# Patient Record
Sex: Female | Born: 1995 | Race: Black or African American | Hispanic: No | Marital: Single | Smoking: Never smoker
Health system: Southern US, Community
[De-identification: ages and names within clinical notes are randomized; demographics above are authoritative.]

## PROBLEM LIST (undated history)

## (undated) DIAGNOSIS — B009 Herpesviral infection, unspecified: Secondary | ICD-10-CM

## (undated) DIAGNOSIS — J45909 Unspecified asthma, uncomplicated: Secondary | ICD-10-CM

## (undated) DIAGNOSIS — K769 Liver disease, unspecified: Secondary | ICD-10-CM

## (undated) HISTORY — PX: NO PAST SURGERIES: SHX2092

## (undated) HISTORY — PX: WISDOM TOOTH EXTRACTION: SHX21

## (undated) HISTORY — DX: Liver disease, unspecified: K76.9

---
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
## 2014-01-14 ENCOUNTER — Inpatient Hospital Stay (HOSPITAL_COMMUNITY): Payer: Medicaid Other

## 2014-01-14 ENCOUNTER — Encounter (HOSPITAL_COMMUNITY): Payer: Self-pay | Admitting: General Practice

## 2014-01-14 ENCOUNTER — Inpatient Hospital Stay (HOSPITAL_COMMUNITY)
Admission: AD | Admit: 2014-01-14 | Discharge: 2014-01-14 | Disposition: A | Discharge status: Home / Self Care | Payer: Medicaid Other | Source: Ambulatory Visit | Attending: Obstetrics and Gynecology | Admitting: Obstetrics and Gynecology

## 2014-01-14 DIAGNOSIS — N72 Inflammatory disease of cervix uteri: Secondary | ICD-10-CM | POA: Insufficient documentation

## 2014-01-14 DIAGNOSIS — N949 Unspecified condition associated with female genital organs and menstrual cycle: Secondary | ICD-10-CM | POA: Insufficient documentation

## 2014-01-14 DIAGNOSIS — N898 Other specified noninflammatory disorders of vagina: Secondary | ICD-10-CM | POA: Diagnosis present

## 2014-01-14 DIAGNOSIS — R102 Pelvic and perineal pain: Secondary | ICD-10-CM

## 2014-01-14 LAB — URINALYSIS, ROUTINE W REFLEX MICROSCOPIC
Glucose, UA: NEGATIVE mg/dL
Hgb urine dipstick: NEGATIVE
KETONES UR: 40 mg/dL — AB
Leukocytes, UA: NEGATIVE
Nitrite: NEGATIVE
PH: 6 (ref 5.0–8.0)
Protein, ur: 30 mg/dL — AB
Specific Gravity, Urine: 1.025 (ref 1.005–1.030)
Urobilinogen, UA: 0.2 mg/dL (ref 0.0–1.0)

## 2014-01-14 LAB — WET PREP, GENITAL
CLUE CELLS WET PREP: NONE SEEN
TRICH WET PREP: NONE SEEN
Yeast Wet Prep HPF POC: NONE SEEN

## 2014-01-14 LAB — POCT PREGNANCY, URINE: Preg Test, Ur: NEGATIVE

## 2014-01-14 LAB — URINE MICROSCOPIC-ADD ON

## 2014-01-14 LAB — RPR

## 2014-01-14 LAB — HIV ANTIBODY (ROUTINE TESTING W REFLEX): HIV 1&2 Ab, 4th Generation: NONREACTIVE

## 2014-01-14 MED ORDER — NYSTATIN-TRIAMCINOLONE 100000-0.1 UNIT/GM-% EX CREA
TOPICAL_CREAM | Freq: Once | CUTANEOUS | Status: AC
Start: 2014-01-14 — End: 2014-01-14
  Administered 2014-01-14: 17:00:00 via TOPICAL
  Filled 2014-01-14: qty 15

## 2014-01-14 MED ORDER — CEFTRIAXONE SODIUM 250 MG IJ SOLR
250.0000 mg | Freq: Once | INTRAMUSCULAR | Status: AC
  Administered 2014-01-14: 250 mg via INTRAMUSCULAR
  Filled 2014-01-14: qty 250

## 2014-01-14 MED ORDER — AZITHROMYCIN 250 MG PO TABS
1000.0000 mg | ORAL_TABLET | Freq: Once | ORAL | Status: AC
  Administered 2014-01-14: 1000 mg via ORAL
  Filled 2014-01-14: qty 4

## 2014-01-14 NOTE — Discharge Instructions (Signed)
Abdominal Pain, Women °Abdominal (stomach, pelvic, or belly) pain can be caused by many things. It is important to tell your doctor: °· The location of the pain. °· Does it come and go or is it present all the time? °· Are there things that start the pain (eating certain foods, exercise)? °· Are there other symptoms associated with the pain (fever, nausea, vomiting, diarrhea)? °All of this is helpful to know when trying to find the cause of the pain. °CAUSES  °· Stomach: virus or bacteria infection, or ulcer. °· Intestine: appendicitis (inflamed appendix), regional ileitis (Crohn's disease), ulcerative colitis (inflamed colon), irritable bowel syndrome, diverticulitis (inflamed diverticulum of the colon), or cancer of the stomach or intestine. °· Gallbladder disease or stones in the gallbladder. °· Kidney disease, kidney stones, or infection. °· Pancreas infection or cancer. °· Fibromyalgia (pain disorder). °· Diseases of the female organs: °¨ Uterus: fibroid (non-cancerous) tumors or infection. °¨ Fallopian tubes: infection or tubal pregnancy. °¨ Ovary: cysts or tumors. °¨ Pelvic adhesions (scar tissue). °¨ Endometriosis (uterus lining tissue growing in the pelvis and on the pelvic organs). °¨ Pelvic congestion syndrome (female organs filling up with blood just before the menstrual period). °¨ Pain with the menstrual period. °¨ Pain with ovulation (producing an egg). °¨ Pain with an IUD (intrauterine device, birth control) in the uterus. °¨ Cancer of the female organs. °· Functional pain (pain not caused by a disease, may improve without treatment). °· Psychological pain. °· Depression. °DIAGNOSIS  °Your doctor will decide the seriousness of your pain by doing an examination. °· Blood tests. °· X-rays. °· Ultrasound. °· CT scan (computed tomography, special type of X-ray). °· MRI (magnetic resonance imaging). °· Cultures, for infection. °· Barium enema (dye inserted in the large intestine, to better view it with  X-rays). °· Colonoscopy (looking in intestine with a lighted tube). °· Laparoscopy (minor surgery, looking in abdomen with a lighted tube). °· Major abdominal exploratory surgery (looking in abdomen with a large incision). °TREATMENT  °The treatment will depend on the cause of the pain.  °· Many cases can be observed and treated at home. °· Over-the-counter medicines recommended by your caregiver. °· Prescription medicine. °· Antibiotics, for infection. °· Birth control pills, for painful periods or for ovulation pain. °· Hormone treatment, for endometriosis. °· Nerve blocking injections. °· Physical therapy. °· Antidepressants. °· Counseling with a psychologist or psychiatrist. °· Minor or major surgery. °HOME CARE INSTRUCTIONS  °· Do not take laxatives, unless directed by your caregiver. °· Take over-the-counter pain medicine only if ordered by your caregiver. Do not take aspirin because it can cause an upset stomach or bleeding. °· Try a clear liquid diet (broth or water) as ordered by your caregiver. Slowly move to a bland diet, as tolerated, if the pain is related to the stomach or intestine. °· Have a thermometer and take your temperature several times a day, and record it. °· Bed rest and sleep, if it helps the pain. °· Avoid sexual intercourse, if it causes pain. °· Avoid stressful situations. °· Keep your follow-up appointments and tests, as your caregiver orders. °· If the pain does not go away with medicine or surgery, you may try: °¨ Acupuncture. °¨ Relaxation exercises (yoga, meditation). °¨ Group therapy. °¨ Counseling. °SEEK MEDICAL CARE IF:  °· You notice certain foods cause stomach pain. °· Your home care treatment is not helping your pain. °· You need stronger pain medicine. °· You want your IUD removed. °· You feel faint or   lightheaded. °· You develop nausea and vomiting. °· You develop a rash. °· You are having side effects or an allergy to your medicine. °SEEK IMMEDIATE MEDICAL CARE IF:  °· Your  pain does not go away or gets worse. °· You have a fever. °· Your pain is felt only in portions of the abdomen. The right side could possibly be appendicitis. The left lower portion of the abdomen could be colitis or diverticulitis. °· You are passing blood in your stools (bright red or black tarry stools, with or without vomiting). °· You have blood in your urine. °· You develop chills, with or without a fever. °· You pass out. °MAKE SURE YOU:  °· Understand these instructions. °· Will watch your condition. °· Will get help right away if you are not doing well or get worse. °Document Released: 11/21/2006 Document Revised: 06/10/2013 Document Reviewed: 12/11/2008 °ExitCare® Patient Information ©2015 [employer]. This information is not intended to replace advice given to you by your health care provider. Make sure you discuss any questions you have with your health care provider. ° °

## 2014-01-14 NOTE — MAU Note (Signed)
Urine in lab 

## 2014-01-14 NOTE — MAU Note (Signed)
Has a d/c; Hurts when she urines, hard to hold it.? Bumps down there- very irriatated.  Been going on about a wk.

## 2014-01-14 NOTE — MAU Provider Note (Signed)
History     CSN: [id number]  Arrival date and time: 01/14/14 1337   None     Chief Complaint  Patient presents with  . Dysuria  . 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 Bumps down there- very irriatated. Been going on about a wk.          Past Medical History  Diagnosis Date  . Asthma     Past Surgical History  Procedure Laterality Date  . No past surgeries    . Wisdom tooth extraction      History reviewed. No pertinent family history.  History  Substance Use Topics  . Smoking status: Never Smoker   . Smokeless tobacco: Not on file  . Alcohol Use: No    Allergies: No Known Allergies  Prescriptions prior to admission  Medication Sig Dispense Refill Last Dose  . acetaminophen-codeine (TYLENOL #3) 300-30 MG per tablet Take 1 tablet by mouth every 6 (six) hours as needed for moderate pain.   Past Week at Unknown time  . albuterol (PROVENTIL HFA;VENTOLIN HFA) 108 (90 BASE) MCG/ACT inhaler Inhale 1 puff into the lungs every 6 (six) hours as needed for wheezing or shortness of breath.   rescue  . docusate sodium (COLACE) 100 MG capsule Take 1 capsule (100 mg total) by mouth 2 (two) times daily as needed. 30 capsule 2   . fluconazole (DIFLUCAN) 150 MG tablet Take 1 tablet (150 mg total) by mouth once. 2 tablet 0     Review of Systems  Constitutional: Negative for fever and chills.  Gastrointestinal: Positive for abdominal pain. Negative for nausea and vomiting.  Genitourinary: Positive for dysuria.   Physical Exam    Blood pressure 116/69, pulse 77, temperature 98.2 F (36.8 C), temperature source Oral, resp. rate 16, last menstrual period 12/31/2013.  Physical Exam  Nursing note and vitals reviewed. Constitutional: She is oriented to person, place, and time. She appears well-developed and well-nourished. No distress.  HENT:  Head: Normocephalic.  Eyes: Pupils are equal, round, and reactive to light.  Neck: Normal range of motion. Neck supple.  Cardiovascular: Normal rate.   Respiratory: Effort normal.  GI: Soft.  Genitourinary:  External - normal appearing female- small bump left labia majora 11 oclock appears to be inclusion cyst or folliculitis- no ulceration noted- mildly tender; pt points to area between labia labia minor and majora that felt like was irritated/painful- no palpable or visible lesions or erythema noted Vagina- mod amount of watery/frothy white/yellow discharge in vault Cervix clean, mildly tender with manipulation Uterus and adnexa tender with manipulation- left more than right- no rebound  Musculoskeletal: Normal range of motion.  Neurological: She is alert and oriented to person, place, and time.  Skin: Skin is warm.  Psychiatric: She has a normal mood and affect.    MAU Course  Procedures Results for orders placed or performed during the hospital encounter of 01/14/14 (from the past 24 hour(s))  Urinalysis, Routine w reflex microscopic     Status: Abnormal  Collection Time: 01/14/14  2:11 PM  Result Value Ref Range   Color, Urine YELLOW YELLOW   APPearance CLEAR CLEAR   Specific Gravity, Urine 1.025 1.005 - 1.030   pH 6.0 5.0 - 8.0   Glucose, UA NEGATIVE NEGATIVE mg/dL   Hgb urine dipstick NEGATIVE NEGATIVE   Bilirubin Urine MODERATE (A) NEGATIVE   Ketones, ur 40 (A) NEGATIVE mg/dL   Protein, ur 30 (A) NEGATIVE mg/dL   Urobilinogen, UA 0.2 0.0 - 1.0 mg/dL   Nitrite NEGATIVE NEGATIVE   Leukocytes, UA NEGATIVE NEGATIVE  Urine microscopic-add on     Status:  Abnormal   Collection Time: 01/14/14  2:11 PM  Result Value Ref Range   Squamous Epithelial / LPF FEW (A) RARE   WBC, UA 0-2 <3 WBC/hpf   RBC / HPF 0-2 <3 RBC/hpf   Bacteria, UA FEW (A) RARE   Urine-Other MUCOUS PRESENT   Pregnancy, urine POC     Status: None   Collection Time: 01/14/14  2:15 PM  Result Value Ref Range   Preg Test, Ur NEGATIVE NEGATIVE  discussed with pt about trying to f/u with PCP- re-evaluate urine Urine culture pending GC/Chlamydia pending Wet prep Treat for cervicitis in MAU- Rocpehin and Zithromax Pelvic US US Transvaginal Non-ob  01/14/2014   CLINICAL DATA:  Pelvic pain in female for 1 week.  LMP 12/31/2013.  EXAM: TRANSABDOMINAL AND TRANSVAGINAL ULTRASOUND OF PELVIS  TECHNIQUE: Both transabdominal and transvaginal ultrasound examinations of the pelvis were performed. Transabdominal technique was performed for global imaging of the pelvis including uterus, ovaries, adnexal regions, and pelvic cul-de-sac. It was necessary to proceed with endovaginal exam following the transabdominal exam to visualize the endometrial stripe and ovaries.  COMPARISON:  None  FINDINGS: Uterus  Measurements: 7.9 x 3.3 x 4.5 cm. No fibroids or other mass visualized.  Endometrium  Thickness: 10 mm. Tri-layered appearance. No focal abnormality visualized.  Right ovary  Measurements: 3.6 x 2.4 x 2.1 cm. Normal appearance/no adnexal mass.  Left ovary  Measurements: 4.6 x 2.7 x 2.4 cm. Normal appearance/no adnexal mass.  Other findings  No free fluid.  IMPRESSION: Normal appearance of uterus and both ovaries. No pelvic mass or other sonographic abnormality identified.   Electronically Signed   By: Myles Rosenthal M.D.   On: 01/14/2014 16:44   US Pelvis Complete  01/14/2014   CLINICAL DATA:  Pelvic pain in female for 1 week.  LMP 12/31/2013.  EXAM: TRANSABDOMINAL AND TRANSVAGINAL ULTRASOUND OF PELVIS  TECHNIQUE: Both transabdominal and transvaginal ultrasound examinations of the pelvis were performed.  Transabdominal technique was performed for global imaging of the pelvis including uterus, ovaries, adnexal regions, and pelvic cul-de-sac. It was necessary to proceed with endovaginal exam following the transabdominal exam to visualize the endometrial stripe and ovaries.  COMPARISON:  None  FINDINGS: Uterus  Measurements: 7.9 x 3.3 x 4.5 cm. No fibroids or other mass visualized.  Endometrium  Thickness: 10 mm. Tri-layered appearance. No focal abnormality visualized.  Right ovary  Measurements: 3.6 x 2.4 x 2.1 cm. Normal appearance/no adnexal mass.  Left ovary  Measurements: 4.6 x 2.7 x 2.4 cm. Normal appearance/no adnexal mass.  Other findings  No free fluid.  IMPRESSION: Normal appearance of uterus and both ovaries. No pelvic mass or other sonographic abnormality identified.   Electronically Signed   By: Myles Rosenthal M.D.   On: 01/14/2014 16:44   Assessment and Plan  Pelvic pain Cervicitis- zithormax 1 gm PO and Rocephin 250mg  IM Vaginal pain- Mycolog  cream F/u with primary care prvider  Pamelia HoitLINEBERRY,Michelle Cole 01/14/2014, 2:28 PM

## 2014-01-15 LAB — URINE CULTURE
Colony Count: NO GROWTH
Culture: NO GROWTH
Special Requests: NORMAL

## 2014-01-15 LAB — GC/CHLAMYDIA PROBE AMP
CT Probe RNA: NEGATIVE
GC PROBE AMP APTIMA: NEGATIVE

## 2014-02-12 ENCOUNTER — Emergency Department (HOSPITAL_COMMUNITY)
Admission: EM | Admit: 2014-02-12 | Discharge: 2014-02-12 | Disposition: A | Discharge status: Home / Self Care | Payer: Medicaid Other | Attending: Emergency Medicine | Admitting: Emergency Medicine

## 2014-02-12 ENCOUNTER — Encounter (HOSPITAL_COMMUNITY): Payer: Self-pay

## 2014-02-12 DIAGNOSIS — Z3202 Encounter for pregnancy test, result negative: Secondary | ICD-10-CM | POA: Diagnosis not present

## 2014-02-12 DIAGNOSIS — B373 Candidiasis of vulva and vagina: Secondary | ICD-10-CM | POA: Diagnosis not present

## 2014-02-12 DIAGNOSIS — N898 Other specified noninflammatory disorders of vagina: Secondary | ICD-10-CM | POA: Diagnosis present

## 2014-02-12 DIAGNOSIS — Z79899 Other long term (current) drug therapy: Secondary | ICD-10-CM | POA: Insufficient documentation

## 2014-02-12 DIAGNOSIS — B3731 Acute candidiasis of vulva and vagina: Secondary | ICD-10-CM

## 2014-02-12 DIAGNOSIS — J45909 Unspecified asthma, uncomplicated: Secondary | ICD-10-CM | POA: Insufficient documentation

## 2014-02-12 HISTORY — DX: Herpesviral infection, unspecified: B00.9

## 2014-02-12 LAB — WET PREP, GENITAL
Trich, Wet Prep: NONE SEEN
Yeast Wet Prep HPF POC: NONE SEEN

## 2014-02-12 LAB — URINALYSIS, ROUTINE W REFLEX MICROSCOPIC
BILIRUBIN URINE: NEGATIVE
Glucose, UA: NEGATIVE mg/dL
HGB URINE DIPSTICK: NEGATIVE
KETONES UR: NEGATIVE mg/dL
Leukocytes, UA: NEGATIVE
Nitrite: NEGATIVE
PH: 7.5 (ref 5.0–8.0)
PROTEIN: NEGATIVE mg/dL
SPECIFIC GRAVITY, URINE: 1.006 (ref 1.005–1.030)
UROBILINOGEN UA: 0.2 mg/dL (ref 0.0–1.0)

## 2014-02-12 LAB — POC URINE PREG, ED: Preg Test, Ur: NEGATIVE

## 2014-02-12 MED ORDER — FLUCONAZOLE 150 MG PO TABS: 150.0000 mg | ORAL_TABLET | Freq: Every day | ORAL | Status: AC

## 2014-02-12 MED ORDER — FLUCONAZOLE 150 MG PO TABS
150.0000 mg | ORAL_TABLET | Freq: Every day | ORAL | Status: DC
  Administered 2014-02-12: 150 mg via ORAL
  Filled 2014-02-12: qty 1

## 2014-02-12 NOTE — ED Notes (Signed)
Pelvic cart set up 

## 2014-02-12 NOTE — ED Provider Notes (Signed)
CSN: [id number]     Arrival date & time 02/12/14  1916 History   First MD Initiated Contact with Patient 02/12/14 2100     Chief Complaint  Patient presents with  . Vaginal Discharge     (Consider location/radiation/quality/duration/timing/severity/associated sxs/prior Treatment) HPI Michelle Cole is a 19 y.o. female who presents to emergency [facility] complaining of vaginal discharge. She states discharge started 1 week ago. Reports she has similar discharge 3 weeks ago, at that time seen a [facility], was worked up for STD. She states she was given a shot IM, states she thinks that her symptoms improved but returned a few days after. Records indicate all her cultures came back negative. Patient denies abdominal pain. No vaginal pain. She denies intercourse since being treated last time. She states discharge is white, malodorous. Also reports itching over her vulvar and vaginal area. States she did use some new white stone there after her period and states she had some rash there after. Denies any fever or chills. No urinary symptoms. No other complaints.  Past Medical History  Diagnosis Date  . Asthma   . Herpes    Past Surgical History  Procedure Laterality Date  . No past surgeries    . Wisdom tooth extraction     History reviewed. No pertinent family history. History  Substance Use Topics  . Smoking status: Never Smoker   . Smokeless tobacco: Not on file  . Alcohol Use: No   OB History    Gravida Para Term Preterm AB TAB SAB Ectopic Multiple Living       Review of Systems  Constitutional: Negative for fever and chills.  Respiratory: Negative for cough, chest tightness and shortness of breath.   Cardiovascular: Negative for chest pain, palpitations and leg swelling.  Gastrointestinal: Negative for nausea, vomiting, abdominal pain and diarrhea.  Genitourinary: Positive for vaginal discharge. Negative for dysuria, flank pain, vaginal bleeding, vaginal  pain and pelvic pain.  Musculoskeletal: Negative for myalgias, arthralgias, neck pain and neck stiffness.  Skin: Negative for rash.  Neurological: Negative for dizziness, weakness and headaches.  All other systems reviewed and are negative.     Allergies  Review of patient's allergies indicates no known allergies.  Home Medications   Prior to Admission medications   Medication Sig Start Date End Date Taking? Authorizing Provider  nystatin-triamcinolone (MYCOLOG II) cream Apply 1 application topically 2 (two) times daily.   Yes Historical Provider, MD  omeprazole (PRILOSEC) 20 MG capsule Take 20 mg by mouth daily.   Yes Historical Provider, MD  albuterol (PROVENTIL HFA;VENTOLIN HFA) 108 (90 BASE) MCG/ACT inhaler Inhale 1 puff into the lungs every 6 (six) hours as needed for wheezing or shortness of breath.    Historical Provider, MD  docusate sodium (COLACE) 100 MG capsule Take 1 capsule (100 mg total) by mouth 2 (two) times daily as needed. Patient not taking: Reported on 01/14/2014 08/03/13   Deirdre C Poe, CNM   BP 124/87 mmHg  Pulse 78  Temp(Src) 98.7 F (37.1 C) (Oral)  Resp 18  Ht  (1.676 m)  SpO2 100%  LMP 01/30/2014 Physical Exam  Constitutional: She appears well-developed and well-nourished. No distress.  HENT:  Head: Normocephalic.  Eyes: Conjunctivae are normal.  Neck: Neck supple.  Cardiovascular: Normal rate, regular rhythm and normal heart sounds.   Pulmonary/Chest: Effort normal and breath sounds normal. No respiratory distress. She has no wheezes. She has no rales.  Abdominal: Soft. Bowel sounds are normal. She exhibits no distension. There is no tenderness. There is no rebound.  Genitourinary:  Normal external genitalia. White crud like vaginal discharge. Vaginal anal erythematous. Cervix is normal. No CMT. No uterine or adnexal tenderness  Musculoskeletal: She exhibits no edema.  Neurological: She is alert.  Skin: Skin is warm and dry.  Psychiatric: She  has a normal mood and affect. Her behavior is normal.  Nursing note and vitals reviewed.   ED Course  Procedures (including critical care time) Labs Review Labs Reviewed  WET PREP, GENITAL - Abnormal; Notable for the following:    Clue Cells Wet Prep HPF POC RARE (*)    WBC, Wet Prep HPF POC FEW (*)    All other components within normal limits  GC/CHLAMYDIA PROBE AMP  URINALYSIS, ROUTINE W REFLEX MICROSCOPIC  POC URINE PREG, ED    Imaging Review No results found.   EKG Interpretation None      MDM   Final diagnoses:  Candida vaginitis    Patient with vaginal discharge. No abdominal or pelvic pain. Exam is consistent with yeast infection. Wet prep unremarkable. GC chlamydia culture sent. Negative urine pregnancy test, UA normal. That was given in emergency department. We'll discharge home with outpatient follow-up.  Filed Vitals:   02/12/14 1953 02/12/14 2239  BP: 114/75 124/87  Pulse: 66 78  Temp: 98.7 F (37.1 C)   TempSrc: Oral   Resp: 18 18  Height: 5\' 6"  (1.676 m)   SpO2: 100% 100%       Lottie Musselatyana A Casmere Hollenbeck, PA-C 02/12/14 2346  Audree CamelScott T Goldston, MD 02/16/14 [phone]

## 2014-02-12 NOTE — Discharge Instructions (Signed)
Take diflucan as prescribed. Follow up with your doctor for recheck.    Candidal Vulvovaginitis Candidal vulvovaginitis is an infection of the vagina and vulva. The vulva is the skin around the opening of the vagina. This may cause itching and discomfort in and around the vagina.  HOME CARE  Only take medicine as told by your doctor.  Do not have sex (intercourse) until the infection is healed or as told by your doctor.  Practice safe sex.  Tell your sex partner about your infection.  Do not douche or use tampons.  Wear cotton underwear. Do not wear tight pants or panty hose.  Eat yogurt. This may help treat and prevent yeast infections. GET HELP RIGHT AWAY IF:   You have a fever.  Your problems get worse during treatment or do not get better in 3 days.  You have discomfort, irritation, or itching in your vagina or vulva area.  You have pain after sex.  You start to get belly (abdominal) pain. MAKE SURE YOU:  Understand these instructions.  Will watch your condition.  Will get help right away if you are not doing well or get worse. Document Released: 04/22/2008 Document Revised: 01/29/2013 Document Reviewed: 04/22/2008 [facility] Patient Information 2015 [place], [place]. This information is not intended to replace advice given to you by your health care provider. Make sure you discuss any questions you have with your health care provider.

## 2014-02-12 NOTE — ED Notes (Signed)
Pt complains of vaginal burning and white discharge for one week

## 2014-02-13 LAB — GC/CHLAMYDIA PROBE AMP
CT Probe RNA: NEGATIVE
GC PROBE AMP APTIMA: NEGATIVE

## 2016-07-15 ENCOUNTER — Encounter (HOSPITAL_COMMUNITY): Payer: Self-pay | Admitting: *Deleted

## 2016-07-15 ENCOUNTER — Ambulatory Visit (HOSPITAL_COMMUNITY)
Admission: EM | Admit: 2016-07-15 | Discharge: 2016-07-15 | Disposition: A | Discharge status: Home / Self Care | Payer: BLUE CROSS/BLUE SHIELD | Attending: Physician Assistant | Admitting: Physician Assistant

## 2016-07-15 DIAGNOSIS — L739 Follicular disorder, unspecified: Secondary | ICD-10-CM | POA: Diagnosis not present

## 2016-07-15 LAB — POCT URINALYSIS DIP (DEVICE)
Bilirubin Urine: NEGATIVE
GLUCOSE, UA: NEGATIVE mg/dL
Hgb urine dipstick: NEGATIVE
Ketones, ur: NEGATIVE mg/dL
Leukocytes, UA: NEGATIVE
Nitrite: NEGATIVE
PROTEIN: NEGATIVE mg/dL
SPECIFIC GRAVITY, URINE: 1.02 (ref 1.005–1.030)
UROBILINOGEN UA: 0.2 mg/dL (ref 0.0–1.0)
pH: 6.5 (ref 5.0–8.0)

## 2016-07-15 MED ORDER — MUPIROCIN CALCIUM 2 % EX CREA: 1.0000 "application " | TOPICAL_CREAM | Freq: Two times a day (BID) | CUTANEOUS | 0 refills | Status: DC

## 2016-07-15 NOTE — Discharge Instructions (Signed)
Your urine is clear, no signs of infection. You have a bacterial infection most likely from shaving. Treat with cream lightly to area 2x a day x 5 days. If not improving please f/u with GYN as listed above. Keep clean and dry. Avoid shaving for a week.

## 2016-07-15 NOTE — ED Provider Notes (Signed)
CSN: [id number]     Arrival date & time 07/15/16  1303 History   First MD Initiated Contact with Patient 07/15/16 1321     Chief Complaint  Patient presents with  . Abscess   (Consider location/radiation/quality/duration/timing/severity/associated sxs/prior Treatment) 21 yo presents with a "bump" to groin region. She reports having it for 3 weeks and went to a MD in [place] and was given Doxycycline for 7 days. This was completed a week ago. The area is still there. She notes tenderness and itching with urination in those areas. No fever or chills. They are painful to touch. She carries a history of herpes and therefore is concerned. No vaginal discharge or odor is noted.       Past Medical History:  Diagnosis Date  . Asthma   . Herpes    Past Surgical History:  Procedure Laterality Date  . NO PAST SURGERIES    . WISDOM TOOTH EXTRACTION     History reviewed. No pertinent family history. Social History  Substance Use Topics  . Smoking status: Never Smoker  . Smokeless tobacco: Not on file  . Alcohol use No   OB History    Gravida Para Term Preterm AB Living   0 0 0 0 0 0   SAB TAB Ectopic Multiple Live Births   0 0 0 0       Review of Systems  Constitutional: Negative for chills and fever.  Gastrointestinal: Negative for abdominal pain.  Genitourinary: Negative.  Negative for dysuria, pelvic pain and urgency.       Some burning locally with urination  Neurological: Negative.   Psychiatric/Behavioral: Negative.   All other systems reviewed and are negative.   Allergies  Patient has no known allergies.  Home Medications   Prior to Admission medications   Medication Sig Start Date End Date Taking? Authorizing Provider  albuterol (PROVENTIL HFA;VENTOLIN HFA) 108 (90 BASE) MCG/ACT inhaler Inhale 1 puff into the lungs every 6 (six) hours as needed for wheezing or shortness of breath.    [provider]  docusate sodium (COLACE) 100 MG capsule Take 1 capsule  (100 mg total) by mouth 2 (two) times daily as needed. Patient not taking: Reported on 01/14/2014 08/03/13   Poe, Deirdre C, CNM  mupirocin cream (BACTROBAN) 2 % Apply 1 application topically 2 (two) times daily. 07/15/16   Riki Sheer, PA-C  nystatin-triamcinolone (MYCOLOG II) cream Apply 1 application topically 2 (two) times daily.    [provider]  omeprazole (PRILOSEC) 20 MG capsule Take 20 mg by mouth daily.    [provider]   Meds Ordered and Administered this Visit  Medications - No data to display  BP 130/72 (BP Location: Right Arm)   Pulse 78   Temp 98.6 F (37 C) (Oral)   Resp 18   LMP 06/22/2016   SpO2 100%  No data found.   Physical Exam  Constitutional: She appears well-developed and well-nourished.  Genitourinary: Vagina normal.  Skin: Skin is warm and dry.  Labia majora and surrounding regions with small inflammatory hair follicles. No abscess is noted. No vesicles. No vaginal discharge.   Psychiatric: Her behavior is normal.  Nursing note and vitals reviewed.   Urgent Care Course     Procedures (including critical care time)  Labs Review Labs Reviewed  POCT URINALYSIS DIP (DEVICE)    Imaging Review No results found.   Visual Acuity Review  Right Eye Distance:   Left Eye Distance:  Bilateral Distance:    Right Eye Near:   Left Eye Near:    Bilateral Near:         MDM   1. Folliculitis    There is no indication of HSV or abscess. These appear to be folliculitis from perhaps shaving. Urine was performed for completeness and is normal. Treat locally with thin layer of Bactroban twice daily no more than 5 days. Avoid inner minora or vaginal opening. Discussed hygeine with shaving and clean razors. FU with OB GYN if continues or concerns. Information given for this.     Riki SheerYoung, Sharesa Kemp G, PA-C 07/15/16 1505

## 2016-07-15 NOTE — ED Triage Notes (Signed)
Pt  Reports    Seen        sev   Weeks  Ago        [place]  For  A  Presumed    Boil  In  Vaginal  Area      She  Reports   She  Was  Given  An  Unknown  Anti  Biotic          And   She  Still  Has  Symptoms   She  Reports  At this  Time  There  Are  Several  Bumps  presnet  In  Vaginal  Area   Which  Are  painfull  To  Touch

## 2016-08-03 ENCOUNTER — Telehealth: Payer: Self-pay

## 2016-08-03 NOTE — Telephone Encounter (Signed)
[facility] pages to [facility] Historical Provider

## 2016-08-05 ENCOUNTER — Telehealth: Payer: Self-pay

## 2016-08-05 NOTE — Telephone Encounter (Signed)
[facility] forward 14 pages to GI Historical Provider

## 2016-08-11 ENCOUNTER — Telehealth: Payer: Self-pay | Admitting: Gastroenterology

## 2016-08-11 NOTE — Telephone Encounter (Signed)
Records have been placed on 08/05/16 AM Doctor of the Day Dr.Nandigam's desk for review.

## 2016-08-15 ENCOUNTER — Encounter: Payer: Self-pay | Admitting: Gastroenterology

## 2016-08-15 NOTE — Telephone Encounter (Signed)
Dr. Lavon PaganiniNandigam reviewed records and has accepted patient. Ok to schedule Office Visit. No answer, voicemail not set up. I will try again later.

## 2016-08-28 ENCOUNTER — Encounter (HOSPITAL_COMMUNITY): Payer: Self-pay | Admitting: Emergency Medicine

## 2016-08-28 ENCOUNTER — Ambulatory Visit (HOSPITAL_COMMUNITY)
Admission: EM | Admit: 2016-08-28 | Discharge: 2016-08-28 | Disposition: A | Discharge status: Home / Self Care | Payer: BLUE CROSS/BLUE SHIELD | Attending: Family Medicine | Admitting: Family Medicine

## 2016-08-28 DIAGNOSIS — J029 Acute pharyngitis, unspecified: Secondary | ICD-10-CM

## 2016-08-28 DIAGNOSIS — H6501 Acute serous otitis media, right ear: Secondary | ICD-10-CM | POA: Diagnosis not present

## 2016-08-28 MED ORDER — IPRATROPIUM BROMIDE 0.06 % NA SOLN: 2.0000 | Freq: Four times a day (QID) | NASAL | 0 refills | Status: DC

## 2016-08-28 MED ORDER — BENZONATATE 100 MG PO CAPS: 200.0000 mg | ORAL_CAPSULE | Freq: Three times a day (TID) | ORAL | 0 refills | Status: DC | PRN

## 2016-08-28 MED ORDER — AMOXICILLIN 875 MG PO TABS: 875.0000 mg | ORAL_TABLET | Freq: Two times a day (BID) | ORAL | 0 refills | Status: DC

## 2016-08-28 NOTE — ED Triage Notes (Signed)
Cough, seen by provider

## 2016-08-28 NOTE — ED Provider Notes (Signed)
CSN: [id number]     Arrival date & time 08/28/16  1311 History   None    No chief complaint on file.  (Consider location/radiation/quality/duration/timing/severity/associated sxs/prior Treatment) Patient c/o cough, sore throat, and right ear discomfort for 3 days.    URI  Presenting symptoms: congestion, cough, ear pain, fatigue and sore throat   Severity:  Moderate Onset quality:  Sudden Duration:  3 days Timing:  Constant Progression:  Worsening Chronicity:  New Relieved by:  Nothing Worsened by:  Nothing Ineffective treatments:  None tried   Past Medical History:  Diagnosis Date  . Asthma   . Herpes    Past Surgical History:  Procedure Laterality Date  . NO PAST SURGERIES    . WISDOM TOOTH EXTRACTION     No family history on file. Social History  Substance Use Topics  . Smoking status: Never Smoker  . Smokeless tobacco: Not on file  . Alcohol use No   OB History    Gravida Para Term Preterm AB Living   0 0 0 0 0 0   SAB TAB Ectopic Multiple Live Births   0 0 0 0       Review of Systems  Constitutional: Positive for fatigue.  HENT: Positive for congestion, ear pain and sore throat.   Eyes: Negative.   Respiratory: Positive for cough.   Cardiovascular: Negative.   Gastrointestinal: Negative.   Endocrine: Negative.   Genitourinary: Negative.   Musculoskeletal: Negative.   Allergic/Immunologic: Negative.   Neurological: Negative.   Hematological: Negative.   Psychiatric/Behavioral: Negative.     Allergies  Patient has no known allergies.  Home Medications   Prior to Admission medications   Medication Sig Start Date End Date Taking? Authorizing Provider  albuterol (PROVENTIL HFA;VENTOLIN HFA) 108 (90 BASE) MCG/ACT inhaler Inhale 1 puff into the lungs every 6 (six) hours as needed for wheezing or shortness of breath.    [provider]  amoxicillin (AMOXIL) 875 MG tablet Take 1 tablet (875 mg total) by mouth 2 (two) times daily. 08/28/16    Deatra Canter, FNP  benzonatate (TESSALON) 100 MG capsule Take 2 capsules (200 mg total) by mouth 3 (three) times daily as needed for cough. 08/28/16   Deatra Canter, FNP  docusate sodium (COLACE) 100 MG capsule Take 1 capsule (100 mg total) by mouth 2 (two) times daily as needed. Patient not taking: Reported on 01/14/2014 08/03/13   Poe, Deirdre C, CNM  ipratropium (ATROVENT) 0.06 % nasal spray Place 2 sprays into both nostrils 4 (four) times daily. 08/28/16   Deatra Canter, FNP  mupirocin cream (BACTROBAN) 2 % Apply 1 application topically 2 (two) times daily. 07/15/16   Riki Sheer, PA-C  nystatin-triamcinolone (MYCOLOG II) cream Apply 1 application topically 2 (two) times daily.    [provider]  omeprazole (PRILOSEC) 20 MG capsule Take 20 mg by mouth daily.    [provider]   Meds Ordered and Administered this Visit  Medications - No data to display  There were no vitals taken for this visit. No data found.   Physical Exam  Constitutional: She appears well-developed and well-nourished.  HENT:  Head: Normocephalic and atraumatic.  Left Ear: External ear normal.  Right TM erythematous.  Opx erythematous and tonsils 2 plus with exudates.  Eyes: Pupils are equal, round, and reactive to light. Conjunctivae and EOM are normal.  Neck: Normal range of motion. Neck supple.  Cardiovascular: Normal rate, regular rhythm and normal heart sounds.  Pulmonary/Chest: Effort normal and breath sounds normal.  Abdominal: Soft. Bowel sounds are normal.  Nursing note and vitals reviewed.   Urgent Care Course     Procedures (including critical care time)  Labs Review Labs Reviewed - No data to display  Imaging Review No results found.   Visual Acuity Review  Right Eye Distance:   Left Eye Distance:   Bilateral Distance:    Right Eye Near:   Left Eye Near:    Bilateral Near:         MDM   1. Right acute serous otitis media, recurrence not  specified   2. Acute pharyngitis, unspecified etiology    Amoxicillin 875mg  one po bid x 10 days Ipratropium nasal spray [address]     Deatra CanterOxford, Leahanna Buser J, OregonFNP 08/28/16 1437

## 2016-10-05 ENCOUNTER — Ambulatory Visit: Payer: BLUE CROSS/BLUE SHIELD | Admitting: Gastroenterology

## 2016-11-02 ENCOUNTER — Encounter: Payer: Self-pay | Admitting: *Deleted

## 2016-11-09 ENCOUNTER — Other Ambulatory Visit (INDEPENDENT_AMBULATORY_CARE_PROVIDER_SITE_OTHER): Payer: BLUE CROSS/BLUE SHIELD

## 2016-11-09 ENCOUNTER — Ambulatory Visit (INDEPENDENT_AMBULATORY_CARE_PROVIDER_SITE_OTHER): Payer: BLUE CROSS/BLUE SHIELD | Admitting: Gastroenterology

## 2016-11-09 ENCOUNTER — Encounter (INDEPENDENT_AMBULATORY_CARE_PROVIDER_SITE_OTHER): Payer: Self-pay

## 2016-11-09 ENCOUNTER — Encounter: Payer: Self-pay | Admitting: Gastroenterology

## 2016-11-09 VITALS — BP 110/70 | HR 72 | Ht 66.5 in | Wt 209.0 lb

## 2016-11-09 DIAGNOSIS — R1011 Right upper quadrant pain: Secondary | ICD-10-CM

## 2016-11-09 DIAGNOSIS — R932 Abnormal findings on diagnostic imaging of liver and biliary tract: Secondary | ICD-10-CM | POA: Diagnosis not present

## 2016-11-09 DIAGNOSIS — R630 Anorexia: Secondary | ICD-10-CM

## 2016-11-09 DIAGNOSIS — K219 Gastro-esophageal reflux disease without esophagitis: Secondary | ICD-10-CM

## 2016-11-09 DIAGNOSIS — R5383 Other fatigue: Secondary | ICD-10-CM

## 2016-11-09 LAB — VITAMIN B12: Vitamin B-12: 302 pg/mL (ref 211–911)

## 2016-11-09 LAB — CBC WITH DIFFERENTIAL/PLATELET
BASOS PCT: 1 % (ref 0.0–3.0)
Basophils Absolute: 0.1 10*3/uL (ref 0.0–0.1)
EOS ABS: 0.2 10*3/uL (ref 0.0–0.7)
EOS PCT: 3.2 % (ref 0.0–5.0)
HCT: 36 % (ref 36.0–46.0)
HEMOGLOBIN: 11.8 g/dL — AB (ref 12.0–15.0)
Lymphocytes Relative: 41.1 % (ref 12.0–46.0)
Lymphs Abs: 2.6 10*3/uL (ref 0.7–4.0)
MCHC: 32.7 g/dL (ref 30.0–36.0)
MCV: 83.1 fl (ref 78.0–100.0)
Monocytes Absolute: 0.4 10*3/uL (ref 0.1–1.0)
Monocytes Relative: 5.7 % (ref 3.0–12.0)
Neutro Abs: 3.1 10*3/uL (ref 1.4–7.7)
Neutrophils Relative %: 49 % (ref 43.0–77.0)
Platelets: 226 10*3/uL (ref 150.0–400.0)
RBC: 4.33 Mil/uL (ref 3.87–5.11)
RDW: 13.5 % (ref 11.5–15.5)
WBC: 6.4 10*3/uL (ref 4.0–10.5)

## 2016-11-09 LAB — PROTIME-INR
INR: 1.2 ratio — AB (ref 0.8–1.0)
Prothrombin Time: 13 s (ref 9.6–13.1)

## 2016-11-09 LAB — COMPREHENSIVE METABOLIC PANEL
ALK PHOS: 50 U/L (ref 39–117)
ALT: 7 U/L (ref 0–35)
AST: 11 U/L (ref 0–37)
Albumin: 3.9 g/dL (ref 3.5–5.2)
BUN: 10 mg/dL (ref 6–23)
CO2: 29 mEq/L (ref 19–32)
Calcium: 9 mg/dL (ref 8.4–10.5)
Chloride: 103 mEq/L (ref 96–112)
Creatinine, Ser: 0.79 mg/dL (ref 0.40–1.20)
GFR: 116.26 mL/min (ref >60.00)
Glucose, Bld: 108 mg/dL — ABNORMAL HIGH (ref 70–99)
POTASSIUM: 3.8 meq/L (ref 3.5–5.1)
SODIUM: 137 meq/L (ref 135–145)
TOTAL PROTEIN: 7.5 g/dL (ref 6.0–8.3)
Total Bilirubin: 0.2 mg/dL (ref 0.2–1.2)

## 2016-11-09 LAB — FOLATE: Folate: 10.4 ng/mL (ref >5.9)

## 2016-11-09 LAB — C-REACTIVE PROTEIN: CRP: 0.5 mg/dL (ref 0.5–20.0)

## 2016-11-09 MED ORDER — OMEPRAZOLE 40 MG PO CPDR: 40.0000 mg | DELAYED_RELEASE_CAPSULE | Freq: Every day | ORAL | 11 refills | Status: DC

## 2016-11-09 NOTE — Patient Instructions (Signed)
Go to the basement for labs today Follow up in 4 months 

## 2016-11-09 NOTE — Progress Notes (Signed)
Michelle Cole    [id number]    Aug 20, 1995  Primary Care Physician:Patient, No Pcp Per  Referring Physician: No referring provider defined for this encounter.  Chief complaint:  RUQ abdominal pain   HPI:  21 year old female here for new patient visit with complaints of right upper quadrant abdominal pain. Patient was previously followed by [facility] [place]. She had abnormal liver imaging, noted to have a 3.2 cm peripherally enhancing mass in the right lobe of liver consistent with hemangioma. Normal LFT. Normal AFP.  She continues to have intermittent right upper quadrant discomfort for past 4-5 years. Patient also complains of bloating and excessive gas. She has heartburn and retrosternal chest pain. Denies any dysphagia, odynophagia, nausea or vomiting. She has gained weight in the past 1-2 years.  Right upper quadrant abdominal pain was thought to be musculoskeletal in nature. She was also given a trial of Bentyl with no significant improvement.  Heartburn had improved when she was taking omeprazole daily, and out of prescription no longer has that. Denies any change in bowel habits, melena or blood per rectum No family history of GI malignancy   Outpatient Encounter Prescriptions as of 11/09/2016  Medication Sig  . albuterol (PROVENTIL HFA;VENTOLIN HFA) 108 (90 BASE) MCG/ACT inhaler Inhale 1 puff into the lungs every 6 (six) hours as needed for wheezing or shortness of breath.  . cholecalciferol (VITAMIN D) 1000 units tablet Take 1,000 Units by mouth daily.  Marland Kitchen ipratropium (ATROVENT) 0.06 % nasal spray Place 2 sprays into both nostrils 4 (four) times daily.  . [DISCONTINUED] amoxicillin (AMOXIL) 875 MG tablet Take 1 tablet (875 mg total) by mouth 2 (two) times daily.  . [DISCONTINUED] benzonatate (TESSALON) 100 MG capsule Take 2 capsules (200 mg total) by mouth 3 (three) times daily as needed for cough.  . [DISCONTINUED]  docusate sodium (COLACE) 100 MG capsule Take 1 capsule (100 mg total) by mouth 2 (two) times daily as needed. (Patient not taking: Reported on 01/14/2014)  . [DISCONTINUED] mupirocin cream (BACTROBAN) 2 % Apply 1 application topically 2 (two) times daily.  . [DISCONTINUED] nystatin-triamcinolone (MYCOLOG II) cream Apply 1 application topically 2 (two) times daily.  . [DISCONTINUED] omeprazole (PRILOSEC) 20 MG capsule Take 20 mg by mouth daily.   No facility-administered encounter medications on file as of 11/09/2016.     Allergies as of 11/09/2016  . (No Known Allergies)    Past Medical History:  Diagnosis Date  . Asthma   . Hepatic lesion   . Herpes     Past Surgical History:  Procedure Laterality Date  . NO PAST SURGERIES    . WISDOM TOOTH EXTRACTION      History reviewed. No pertinent family history.  Social History   Social History  . Marital status: Single    Spouse name: N/A  . Number of children: N/A  . Years of education: N/A   Occupational History  . Not on file.   Social History Main Topics  . Smoking status: Never Smoker  . Smokeless tobacco: Never Used  . Alcohol use No  . Drug use: No  . Sexual activity: Yes    Birth control/ protection: None   Other Topics Concern  . Not on file   Social History Narrative  . No narrative on file      Review of systems: Review of Systems  Constitutional: Negative for fever and chills.  HENT: Negative.  Eyes: Negative for blurred vision.  Respiratory: Negative for cough, shortness of breath and wheezing.   Cardiovascular: Negative for chest pain and palpitations.  Gastrointestinal: as per HPI Genitourinary: Negative for dysuria, urgency, frequency and hematuria.  Musculoskeletal: Negative for myalgias, back pain and joint pain.  Skin: Negative for itching and rash.  Neurological: Negative for dizziness, tremors, focal weakness, seizures and loss of consciousness.  Endo/Heme/Allergies: Positive for seasonal  allergies.  Psychiatric/Behavioral: Negative for depression, suicidal ideas and hallucinations.  All other systems reviewed and are negative.   Physical Exam: Vitals:   11/09/16 1414  BP: 110/70  Pulse: 72   Body mass index is 33.23 kg/m. Gen:      No acute distress HEENT:  EOMI, sclera anicteric Neck:     No masses; no thyromegaly Lungs:    Clear to auscultation bilaterally; normal respiratory effort CV:         Regular rate and rhythm; no murmurs Abd:      + bowel sounds; soft, non-tender; no palpable masses, no distension Ext:    No edema; adequate peripheral perfusion Skin:      Warm and dry; no rash Neuro: alert and oriented x 3 Psych: normal mood and affect  Data Reviewed:  Reviewed labs, radiology imaging, old records and pertinent past GI work up  Ultrasound abdomen January 2018 Mildly hypoechoic solid appearing lesion measuring up to 3.9 cm stable appearance compared to prior ultrasound  Assessment and Plan/Recommendations:  21 year old female here with complaints of chronic right upper quadrant abdominal pain and GERD Patient has 3.9 cm lesion in the right lobe of liver, slow growing, differential includes FNH versus adenoma versus hemangioma Follow-up CBC, CMP, AFP, PT and INR We'll obtain MRI liver protocol for surveillance in January 2019 Discussed with patient to avoid nsaids, oral contraceptive pills, herbal remedies, hormone replacement or steroids  GERD: Start omeprazole 40 mg daily Anti reflux measures Small frequent meals  Return in 2-3 months or sooner if needed  K. Scherry Ran , MD [phone] Mon-Fri 8a-5p [phone] after 5p, weekends, holidays  CC: No ref. provider found

## 2016-11-10 LAB — AFP TUMOR MARKER: AFP TUMOR MARKER: 1.1 ng/mL

## 2016-12-20 ENCOUNTER — Encounter (HOSPITAL_COMMUNITY): Payer: Self-pay | Admitting: Emergency Medicine

## 2016-12-20 ENCOUNTER — Ambulatory Visit (HOSPITAL_COMMUNITY)
Admission: EM | Admit: 2016-12-20 | Discharge: 2016-12-20 | Disposition: A | Discharge status: Home / Self Care | Payer: BLUE CROSS/BLUE SHIELD | Attending: Family Medicine | Admitting: Family Medicine

## 2016-12-20 ENCOUNTER — Other Ambulatory Visit: Payer: Self-pay

## 2016-12-20 DIAGNOSIS — N898 Other specified noninflammatory disorders of vagina: Secondary | ICD-10-CM | POA: Insufficient documentation

## 2016-12-20 DIAGNOSIS — R102 Pelvic and perineal pain: Secondary | ICD-10-CM | POA: Insufficient documentation

## 2016-12-20 DIAGNOSIS — Z79899 Other long term (current) drug therapy: Secondary | ICD-10-CM | POA: Diagnosis not present

## 2016-12-20 DIAGNOSIS — R32 Unspecified urinary incontinence: Secondary | ICD-10-CM | POA: Diagnosis present

## 2016-12-20 LAB — POCT URINALYSIS DIP (DEVICE)
BILIRUBIN URINE: NEGATIVE
Glucose, UA: NEGATIVE mg/dL
HGB URINE DIPSTICK: NEGATIVE
KETONES UR: NEGATIVE mg/dL
LEUKOCYTES UA: NEGATIVE
NITRITE: NEGATIVE
Protein, ur: 30 mg/dL — AB
Specific Gravity, Urine: 1.015 (ref 1.005–1.030)
Urobilinogen, UA: 0.2 mg/dL (ref 0.0–1.0)
pH: 8.5 — ABNORMAL HIGH (ref 5.0–8.0)

## 2016-12-20 LAB — POCT PREGNANCY, URINE: Preg Test, Ur: NEGATIVE

## 2016-12-20 MED ORDER — CEPHALEXIN 500 MG PO CAPS: 500.0000 mg | ORAL_CAPSULE | Freq: Two times a day (BID) | ORAL | 0 refills | Status: AC

## 2016-12-20 MED ORDER — FLUCONAZOLE 150 MG PO TABS
150.0000 mg | ORAL_TABLET | Freq: Every day | ORAL | 0 refills | Status: DC
Start: 2016-12-20 — End: 2017-04-14

## 2016-12-20 NOTE — ED Triage Notes (Signed)
Pt reports lower abdominal pain with urination, a clear vaginal discharge and urinary incontinence.  She had one episode of incontinence last night, but has been having the abdominal pain for 3-4 days.

## 2016-12-20 NOTE — ED Provider Notes (Signed)
[facility]    CSN: [id number] Arrival date & time: 12/20/16  1323     History   Chief Complaint Chief Complaint  Patient presents with  . Urinary Incontinence  . Vaginal Discharge  . Abdominal Pain    HPI Michelle Cole is a 21 y.o. female.   21 year old female comes in for 3-4 day history of low abdominal pain with urinary frequency/urgency/dysuria. States woke up this morning and had wet her bed. Denies fever, chills, night sweats. Denies nausea, vomiting, diarrhea, constipation. Last BM yesterday with some straining and has felt bloated with gas. Has also noticed clear vaginal discharge, vaginal itching with bumps. States bumps has been there since 07/2016, was evaluated and told to be folliculitis. LMP mid last month, does not recall exact date. Sexually active with 1 female partner, with use of toys/objects. States clean them every time and applied condoms as well. She recently switched body soaps, no other obvious new exposures. She has an appointment with GYN in 3 days, came in due to urinary incontinence.      Past Medical History:  Diagnosis Date  . Asthma   . Hepatic lesion   . Herpes     There are no active problems to display for this patient.   Past Surgical History:  Procedure Laterality Date  . NO PAST SURGERIES    . WISDOM TOOTH EXTRACTION      OB History    Gravida Para Term Preterm AB Living   0 0 0 0 0 0   SAB TAB Ectopic Multiple Live Births   0 0 0 0         Home Medications    Prior to Admission medications   Medication Sig Start Date End Date Taking? Authorizing Provider  cholecalciferol (VITAMIN D) 1000 units tablet Take 1,000 Units by mouth daily.   Yes [provider]  albuterol (PROVENTIL HFA;VENTOLIN HFA) 108 (90 BASE) MCG/ACT inhaler Inhale 1 puff into the lungs every 6 (six) hours as needed for wheezing or shortness of breath.    [provider]  cephALEXin (KEFLEX) 500 MG capsule Take 1 capsule (500  mg total) 2 (two) times daily for 7 days by mouth. 12/20/16 12/27/16  Cathie HoopsYu, Lasaro Primm V, PA-C  fluconazole (DIFLUCAN) 150 MG tablet Take 1 tablet (150 mg total) daily by mouth. Take second dose 72 hours later if symptoms still persists. 12/20/16   Cathie HoopsYu, Macio Kissoon V, PA-C  ipratropium (ATROVENT) 0.06 % nasal spray Place 2 sprays into both nostrils 4 (four) times daily. 08/28/16   Deatra Canterxford, William J, FNP  omeprazole (PRILOSEC) 40 MG capsule Take 1 capsule (40 mg total) by mouth daily. 11/09/16   Napoleon FormNandigam, Kavitha V, MD    Family History History reviewed. No pertinent family history.  Social History Social History   Tobacco Use  . Smoking status: Never Smoker  . Smokeless tobacco: Never Used  Substance Use Topics  . Alcohol use: No  . Drug use: No     Allergies   Patient has no known allergies.   Review of Systems Review of Systems  Reason unable to perform ROS: See HPI as above.     Physical Exam Triage Vital Signs ED Triage Vitals  Enc Vitals Group     BP 12/20/16 1354 (!) 100/57     Pulse Rate 12/20/16 1354 72     Resp --      Temp 12/20/16 1354 98.3 F (36.8 C)     Temp Source  12/20/16 1354 Oral     SpO2 12/20/16 1354 99 %     Weight --      Height --      Head Circumference --      Peak Flow --      Pain Score 12/20/16 1352 7     Pain Loc --      Pain Edu? --      Excl. in GC? --    No data found.  Updated Vital Signs BP (!) 100/57 (BP Location: Left Arm)   Pulse 72   Temp 98.3 F (36.8 C) (Oral)   LMP  (LMP Unknown)   SpO2 99%   Physical Exam  Constitutional: She is oriented to person, place, and time. She appears well-developed and well-nourished. No distress.  HENT:  Head: Normocephalic and atraumatic.  Eyes: Conjunctivae are normal. Pupils are equal, round, and reactive to light.  Cardiovascular: Normal rate, regular rhythm and normal heart sounds. Exam reveals no gallop and no friction rub.  No murmur heard. Pulmonary/Chest: Effort normal and breath sounds  normal. She has no wheezes. She has no rales.  Abdominal: Soft. Bowel sounds are normal. She exhibits no mass. There is tenderness (RUQ tenderness, negative murphy's sign. No obvious tenderness on palpation of lower abdomen/suprapubic region). There is no rebound, no guarding and no CVA tenderness.  Genitourinary: Uterus normal. Uterus is not tender. Cervix exhibits discharge. Cervix exhibits no motion tenderness and no friability. Right adnexum displays no mass and no tenderness. Left adnexum displays no mass and no tenderness. No erythema or bleeding in the vagina. Vaginal discharge found.  Genitourinary Comments: No obvious lesions/rash on the labia/vaginal area. Folliculitis seen on bilateral upper/inner thighs.   Neurological: She is alert and oriented to person, place, and time.  Skin: Skin is warm and dry.  Psychiatric: She has a normal mood and affect. Her behavior is normal. Judgment normal.     UC Treatments / Results  Labs (all labs ordered are listed, but only abnormal results are displayed) Labs Reviewed  POCT URINALYSIS DIP (DEVICE) - Abnormal; Notable for the following components:      Result Value   pH 8.5 (*)    Protein, ur 30 (*)    All other components within normal limits  URINE CULTURE  POCT PREGNANCY, URINE  CERVICOVAGINAL ANCILLARY ONLY    EKG  EKG Interpretation None       Radiology No results found.  Procedures Procedures (including critical care time)  Medications Ordered in UC Medications - No data to display   Initial Impression / Assessment and Plan / UC Course  I have reviewed the triage vital signs and the nursing notes.  Pertinent labs & imaging results that were available during my care of the patient were reviewed by me and considered in my medical decision making (see chart for details).    Patient with RUQ pain on exam with negative murphy's, review of notes showed chronic RUQ pain that is currently being evaluated by GI. No obvious  suprapubic/low abdomen tenderness, pelvic exam without CMT, tenderness on the uterus. Urine negative for UTI/blood, however, given history, will treat empirically with keflex, and send urine for culture. Will treat empirically for yeast infection as well, start diflucan as directed. Cytology sent, patient will be contacted with any positive results that require additional treatment. Patient to refrain from sexual activity for the next 7 days. Patient to follow up with GYN for further evaluation as scheduled. Return precautions given.  Final Clinical Impressions(s) / UC Diagnoses   Final diagnoses:  Vaginal discharge  Suprapubic abdominal pain    ED Discharge Orders        Ordered    fluconazole (DIFLUCAN) 150 MG tablet  Daily     12/20/16 1430    cephALEXin (KEFLEX) 500 MG capsule  2 times daily     12/20/16 1430        Belinda FisherYu, Markevion Lattin V, [place] JerseyPA-C 12/20/16 1444

## 2016-12-20 NOTE — Discharge Instructions (Addendum)
Given symptoms, we will treat empirically for an urinary tract infection and send urine for culture. Start keflex as directed. We are also treating you empirically for yeast infection. Start diflucan as directed. Cytology sent, you will be contacted with any positive results that requires further treatment. Refrain from sexual activity for the next 7 days. Discontinue new soap, or any other new exposures, refrain from shaving. Follow up with GYN as scheduled for reevaluation. Monitor for any worsening of symptoms, fever, abdominal pain, nausea, vomiting, to follow up for reevaluation.

## 2016-12-21 LAB — URINE CULTURE

## 2016-12-21 LAB — CERVICOVAGINAL ANCILLARY ONLY
Bacterial vaginitis: NEGATIVE
CANDIDA VAGINITIS: NEGATIVE
CHLAMYDIA, DNA PROBE: NEGATIVE
NEISSERIA GONORRHEA: NEGATIVE
TRICH (WINDOWPATH): NEGATIVE

## 2017-02-22 ENCOUNTER — Encounter (HOSPITAL_COMMUNITY): Payer: Self-pay | Admitting: Emergency Medicine

## 2017-02-22 ENCOUNTER — Ambulatory Visit (HOSPITAL_COMMUNITY)
Admission: EM | Admit: 2017-02-22 | Discharge: 2017-02-22 | Disposition: A | Discharge status: Home / Self Care | Payer: BLUE CROSS/BLUE SHIELD | Attending: Family Medicine | Admitting: Family Medicine

## 2017-02-22 ENCOUNTER — Other Ambulatory Visit: Payer: Self-pay

## 2017-02-22 DIAGNOSIS — W260XXA Contact with knife, initial encounter: Secondary | ICD-10-CM

## 2017-02-22 DIAGNOSIS — S61012A Laceration without foreign body of left thumb without damage to nail, initial encounter: Secondary | ICD-10-CM | POA: Diagnosis not present

## 2017-02-22 NOTE — ED Triage Notes (Signed)
Pt reports cutting her left thumb with a knife while trying to cut an apple.

## 2017-02-22 NOTE — ED Provider Notes (Signed)
[facility]   [id number] 02/22/17 Arrival Time: 1323   SUBJECTIVE:  Michelle Cole is a 22 y.o. female who presents to the urgent care with complaint of left thumb with a knife while trying to cut an apple.  She works at [employer]  Last dT 2017.  Past Medical History:  Diagnosis Date  . Asthma   . Hepatic lesion   . Herpes    History reviewed. No pertinent family history. Social History   Socioeconomic History  . Marital status: Single    Spouse name: Not on file  . Number of children: Not on file  . Years of education: Not on file  . Highest education level: Not on file  Social Needs  . Financial resource strain: Not on file  . Food insecurity - worry: Not on file  . Food insecurity - inability: Not on file  . Transportation needs - medical: Not on file  . Transportation needs - non-medical: Not on file  Occupational History  . Not on file  Tobacco Use  . Smoking status: Never Smoker  . Smokeless tobacco: Never Used  Substance and Sexual Activity  . Alcohol use: No  . Drug use: No  . Sexual activity: Yes    Birth control/protection: None  Other Topics Concern  . Not on file  Social History Narrative  . Not on file   Current Meds  Medication Sig  . cholecalciferol (VITAMIN D) 1000 units tablet Take 1,000 Units by mouth daily.   No Known Allergies    ROS: As per HPI, remainder of ROS negative.   OBJECTIVE:   Vitals:   02/22/17 1404  BP: 91/65  Pulse: 68  Temp: 98.5 F (36.9 C)  TempSrc: Oral  SpO2: 98%     General appearance: alert; no distress Eyes: PERRL; EOMI; conjunctiva normal HENT: normocephalic; atraumatic;  external ears normal without trauma; nasal mucosa normal; oral mucosa normal Neck: supple Back: no CVA tenderness Extremities: no cyanosis or edema; symmetrical with no gross deformities Skin: warm and dry; 1/2 cm laceration at tip of thumb, closed now. Neurologic: normal gait; grossly normal Psychological: alert  and cooperative; normal mood and affect      Labs:  Results for orders placed or performed during the hospital encounter of 12/20/16  Urine culture  Result Value Ref Range   Specimen Description URINE, RANDOM    Special Requests NONE    Culture MULTIPLE SPECIES PRESENT, SUGGEST RECOLLECTION (A)    Report Status 12/21/2016 FINAL   POCT urinalysis dip (device)  Result Value Ref Range   Glucose, UA NEGATIVE NEGATIVE mg/dL   Bilirubin Urine NEGATIVE NEGATIVE   Ketones, ur NEGATIVE NEGATIVE mg/dL   Specific Gravity, Urine 1.015 1.005 - 1.030   Hgb urine dipstick NEGATIVE NEGATIVE   pH 8.5 (H) 5.0 - 8.0   Protein, ur 30 (A) NEGATIVE mg/dL   Urobilinogen, UA 0.2 0.0 - 1.0 mg/dL   Nitrite NEGATIVE NEGATIVE   Leukocytes, UA NEGATIVE NEGATIVE  Pregnancy, urine POC  Result Value Ref Range   Preg Test, Ur NEGATIVE NEGATIVE  Cervicovaginal ancillary only  Result Value Ref Range   Chlamydia Negative    Neisseria gonorrhea Negative    Bacterial vaginitis Negative for Bacterial Vaginitis Microorganisms    Candida vaginitis Negative for Candida species    Trichomonas Negative     Labs Reviewed - No data to display  No results found.     ASSESSMENT & PLAN:  1. Laceration of left thumb, foreign body  presence unspecified, nail damage status unspecified, initial encounter     No orders of the defined types were placed in this encounter.   Reviewed expectations re: course of current medical issues. Questions answered. Outlined signs and symptoms indicating need for more acute intervention. Patient verbalized understanding. After Visit Summary given.    Procedures: thumb soaked in diluted betadine solution, then dried and dermabonded, then covered with gauze tube dressing.      Elvina SidleLauenstein, Yuriy Cui, MD 02/22/17 1428

## 2017-04-14 ENCOUNTER — Ambulatory Visit (HOSPITAL_COMMUNITY)
Admission: EM | Admit: 2017-04-14 | Discharge: 2017-04-14 | Disposition: A | Discharge status: Home / Self Care | Payer: BLUE CROSS/BLUE SHIELD | Attending: Internal Medicine | Admitting: Internal Medicine

## 2017-04-14 ENCOUNTER — Other Ambulatory Visit: Payer: Self-pay

## 2017-04-14 ENCOUNTER — Encounter (HOSPITAL_COMMUNITY): Payer: Self-pay | Admitting: Emergency Medicine

## 2017-04-14 DIAGNOSIS — R102 Pelvic and perineal pain: Secondary | ICD-10-CM | POA: Diagnosis not present

## 2017-04-14 DIAGNOSIS — R300 Dysuria: Secondary | ICD-10-CM | POA: Diagnosis not present

## 2017-04-14 DIAGNOSIS — R1030 Lower abdominal pain, unspecified: Secondary | ICD-10-CM | POA: Diagnosis not present

## 2017-04-14 DIAGNOSIS — J45909 Unspecified asthma, uncomplicated: Secondary | ICD-10-CM | POA: Diagnosis not present

## 2017-04-14 DIAGNOSIS — Z3202 Encounter for pregnancy test, result negative: Secondary | ICD-10-CM

## 2017-04-14 DIAGNOSIS — R109 Unspecified abdominal pain: Secondary | ICD-10-CM | POA: Diagnosis present

## 2017-04-14 LAB — POCT URINALYSIS DIP (DEVICE)
Bilirubin Urine: NEGATIVE
GLUCOSE, UA: NEGATIVE mg/dL
LEUKOCYTES UA: NEGATIVE
Nitrite: NEGATIVE
PROTEIN: NEGATIVE mg/dL
Specific Gravity, Urine: 1.02 (ref 1.005–1.030)
UROBILINOGEN UA: 0.2 mg/dL (ref 0.0–1.0)
pH: 6 (ref 5.0–8.0)

## 2017-04-14 LAB — POCT PREGNANCY, URINE: Preg Test, Ur: NEGATIVE

## 2017-04-14 MED ORDER — FLUCONAZOLE 150 MG PO TABS: 150.0000 mg | ORAL_TABLET | Freq: Once | ORAL | 0 refills | Status: AC

## 2017-04-14 MED ORDER — METRONIDAZOLE 0.75 % VA GEL: 1.0000 | Freq: Two times a day (BID) | VAGINAL | 0 refills | Status: DC

## 2017-04-14 NOTE — ED Provider Notes (Signed)
[facility]    CSN: [id number] Arrival date & time: 04/14/17  1337     History   Chief Complaint Chief Complaint  Patient presents with  . Abdominal Pain  . Dysuria    HPI Michelle Cole is a 22 y.o. female.   She presents today with 3-day history of suprapubic discomfort and dysuria.  Has some white vaginal discharge, no real change.  No unusual vaginal bleeding.  No change in bowel habits.  No fever, no malaise.  Also describes chronic intermittent right vulvar discomfort, has had this evaluated in the past with no clear diagnosis found.  Would like exam for possible herpes outbreak.    HPI  Past Medical History:  Diagnosis Date  . Asthma   . Hepatic lesion   . Herpes     Past Surgical History:  Procedure Laterality Date  . NO PAST SURGERIES    . WISDOM TOOTH EXTRACTION      OB History    Gravida Para Term Preterm AB Living   0 0 0 0 0 0   SAB TAB Ectopic Multiple Live Births   0 0 0 0         Home Medications    Prior to Admission medications   Medication Sig Start Date End Date Taking? Authorizing Provider  cholecalciferol (VITAMIN D) 1000 units tablet Take 1,000 Units by mouth daily.   Yes [provider]  fluconazole (DIFLUCAN) 150 MG tablet Take 1 tablet (150 mg total) by mouth once for 1 dose. Repeat dose in 3d if needed. 04/14/17 04/14/17  Isa Rankin, MD  metroNIDAZOLE (METROGEL VAGINAL) 0.75 % vaginal gel Place 1 Applicatorful vaginally 2 (two) times daily. 04/14/17   Isa Rankin, MD    Family History History reviewed. No pertinent family history.  Social History Social History   Tobacco Use  . Smoking status: Never Smoker  . Smokeless tobacco: Never Used  Substance Use Topics  . Alcohol use: No  . Drug use: No     Allergies   Patient has no known allergies.   Review of Systems Review of Systems  All other systems reviewed and are negative.    Physical Exam Triage Vital Signs ED Triage Vitals    Enc Vitals Group     BP 04/14/17 1411 112/66     Pulse Rate 04/14/17 1411 73     Resp 04/14/17 1411 18     Temp 04/14/17 1411 97.8 F (36.6 C)     Temp Source 04/14/17 1411 Oral     SpO2 04/14/17 1411 100 %     Weight --      Height --      Pain Score 04/14/17 1409 5     Pain Loc --    Updated Vital Signs BP 112/66 (BP Location: Left Arm)   Pulse 73   Temp 97.8 F (36.6 C) (Oral)   Resp 18   LMP 03/31/2017 (Exact Date)   SpO2 100%  Physical Exam  Constitutional: She is oriented to person, place, and time. No distress.  HENT:  Head: Atraumatic.  Eyes:  Conjugate gaze observed, no eye redness/discharge  Neck: Neck supple.  Cardiovascular: Normal rate.  Pulmonary/Chest: No respiratory distress.  Abdominal: She exhibits no distension.  Genitourinary:  Genitourinary Comments: Mildly tender to palpation in the right vulva, no discrete lesion appreciated or palpated Vulva without focal inflammation/swelling/rash/erythema. Vaginal mucosa unremarkable, cervix unremarkable.  Lots of thick white discharge present Mild/equivocal right adnexal tenderness, no  adnexal enlargement bilaterally, no left adnexal tenderness.  No cervical motion tenderness  Musculoskeletal: Normal range of motion.  Neurological: She is alert and oriented to person, place, and time.  Skin: Skin is warm and dry.  Nursing note and vitals reviewed.    UC Treatments / Results  Labs Results for orders placed or performed during the hospital encounter of 04/14/17  Urine culture  Result Value Ref Range   Specimen Description URINE, CLEAN CATCH    Special Requests NONE    Culture      NO GROWTH Performed at [facility], [address] [place], [place] [address]    Report Status 04/15/2017 FINAL   POCT urinalysis dip (device)  Result Value Ref Range   Glucose, UA NEGATIVE NEGATIVE mg/dL   Bilirubin Urine NEGATIVE NEGATIVE   Ketones, ur TRACE (A) NEGATIVE mg/dL   Specific Gravity, Urine 1.020  1.005 - 1.030   Hgb urine dipstick TRACE (A) NEGATIVE   pH 6.0 5.0 - 8.0   Protein, ur NEGATIVE NEGATIVE mg/dL   Urobilinogen, UA 0.2 0.0 - 1.0 mg/dL   Nitrite NEGATIVE NEGATIVE   Leukocytes, UA NEGATIVE NEGATIVE  Pregnancy, urine POC  Result Value Ref Range   Preg Test, Ur NEGATIVE NEGATIVE    Procedures Procedures (including critical care time) None today  Final Clinical Impressions(s) / UC Diagnoses   Final diagnoses:  Vulvar pain   Exam today was unremarkable, no specific source of pain identified.  Tests for common pelvic infections are pending; the urgent care will contact you if further treatment is needed.  Prescriptions for metronidazole vaginal gel and fluconazole were sent to the pharmacy, to cover the possibility of bacterial vaginosis and yeast contributing to symptoms.  Please follow-up with the women's outpatient clinic to discuss further evaluation that may be helpful  ED Discharge Orders        Ordered    metroNIDAZOLE (METROGEL VAGINAL) 0.75 % vaginal gel  2 times daily     04/14/17 1528    fluconazole (DIFLUCAN) 150 MG tablet   Once     04/14/17 1528         Isa RankinMurray, Morgen Linebaugh Wilson, MD 04/16/17 2104

## 2017-04-14 NOTE — Discharge Instructions (Addendum)
Exam today was unremarkable, no specific source of pain identified.  Tests for common pelvic infections are pending; the urgent care will contact you if further treatment is needed.  Prescriptions for metronidazole vaginal gel and fluconazole were sent to the pharmacy, to cover the possibility of bacterial vaginosis and yeast contributing to symptoms.  Please follow-up with the women's outpatient clinic to discuss further evaluation that may be helpful.

## 2017-04-14 NOTE — ED Triage Notes (Signed)
The patient presented to the [facility] with a complaint of lower abdominal pain and dysuria x 3 days. 

## 2017-04-15 LAB — URINE CULTURE: CULTURE: NO GROWTH

## 2017-04-17 ENCOUNTER — Encounter: Payer: Self-pay | Admitting: Family Medicine

## 2017-04-17 LAB — CERVICOVAGINAL ANCILLARY ONLY
BACTERIAL VAGINITIS: NEGATIVE
CANDIDA VAGINITIS: NEGATIVE
CHLAMYDIA, DNA PROBE: NEGATIVE
Neisseria Gonorrhea: NEGATIVE
TRICH (WINDOWPATH): NEGATIVE

## 2017-05-11 ENCOUNTER — Ambulatory Visit: Payer: BLUE CROSS/BLUE SHIELD | Admitting: Nurse Practitioner

## 2017-05-15 ENCOUNTER — Encounter: Payer: Self-pay | Admitting: Family Medicine

## 2017-05-15 ENCOUNTER — Ambulatory Visit (INDEPENDENT_AMBULATORY_CARE_PROVIDER_SITE_OTHER): Payer: BLUE CROSS/BLUE SHIELD | Admitting: Family Medicine

## 2017-05-15 VITALS — BP 115/84 | HR 69 | Wt 211.5 lb

## 2017-05-15 DIAGNOSIS — Z124 Encounter for screening for malignant neoplasm of cervix: Secondary | ICD-10-CM

## 2017-05-15 DIAGNOSIS — Z113 Encounter for screening for infections with a predominantly sexual mode of transmission: Secondary | ICD-10-CM | POA: Diagnosis not present

## 2017-05-15 DIAGNOSIS — N6081 Other benign mammary dysplasias of right breast: Secondary | ICD-10-CM

## 2017-05-15 DIAGNOSIS — Z0000 Encounter for general adult medical examination without abnormal findings: Secondary | ICD-10-CM

## 2017-05-15 NOTE — Progress Notes (Signed)
GYNECOLOGY ANNUAL PREVENTATIVE CARE ENCOUNTER NOTE  Subjective:   Michelle Cole is a 22 y.o. G0P0000 female, initially scheduled for vulvar pain, which has resolved, but would like a routine annual gynecologic exam.  Current complaints: something on right nipple with white discharge.   Denies abnormal vaginal bleeding, discharge, pelvic pain, problems with intercourse or other gynecologic concerns.    Menses once a month, lasting 5-7 days.  Gynecologic History Patient's last menstrual period was 05/08/2017 (exact date). Patient is sexually active with same sex partners Contraception: none Last Pap: 1 year ago. Results were: abnormal. Unsure of results or where it was done Last mammogram: n/a.  Obstetric History OB History  Gravida Para Term Preterm AB Living  0 0 0 0 0 0  SAB TAB Ectopic Multiple Live Births  0 0 0 0      Past Medical History:  Diagnosis Date  . Asthma   . Hepatic lesion   . Herpes     Past Surgical History:  Procedure Laterality Date  . NO PAST SURGERIES    . WISDOM TOOTH EXTRACTION      Current Outpatient Medications on File Prior to Visit  Medication Sig Dispense Refill  . cholecalciferol (VITAMIN D) 1000 units tablet Take 1,000 Units by mouth daily.     No current facility-administered medications on file prior to visit.     No Known Allergies  Social History   Socioeconomic History  . Marital status: Single    Spouse name: Not on file  . Number of children: Not on file  . Years of education: Not on file  . Highest education level: Not on file  Occupational History  . Not on file  Social Needs  . Financial resource strain: Not on file  . Food insecurity:    Worry: Not on file    Inability: Not on file  . Transportation needs:    Medical: Not on file    Non-medical: Not on file  Tobacco Use  . Smoking status: Never Smoker  . Smokeless tobacco: Never Used  Substance and Sexual Activity  . Alcohol use: No  . Drug use: No  .  Sexual activity: Yes    Birth control/protection: None  Lifestyle  . Physical activity:    Days per week: Not on file    Minutes per session: Not on file  . Stress: Not on file  Relationships  . Social connections:    Talks on phone: Not on file    Gets together: Not on file    Attends religious service: Not on file    Active member of club or organization: Not on file    Attends meetings of clubs or organizations: Not on file    Relationship status: Not on file  . Intimate partner violence:    Fear of current or ex partner: Not on file    Emotionally abused: Not on file    Physically abused: Not on file    Forced sexual activity: Not on file  Other Topics Concern  . Not on file  Social History Narrative  . Not on file    History reviewed. No pertinent family history.  The following portions of the patient's history were reviewed and updated as appropriate: allergies, current medications, past family history, past medical history, past social history, past surgical history and problem list.  Review of Systems Pertinent items noted in HPI and remainder of comprehensive ROS otherwise negative.   Objective:  BP 115/84  Pulse 69   Wt 211 lb 8 oz (95.9 kg)   LMP 05/08/2017 (Exact Date)   BMI 33.63 kg/m  CONSTITUTIONAL: Well-developed, well-nourished female in no acute distress.  HENT:  Normocephalic, atraumatic, External right and left ear normal. Oropharynx is clear and moist EYES: Conjunctivae and EOM are normal. Pupils are equal, round, and reactive to light. No scleral icterus.  NECK: Normal range of motion, supple, no masses.  Normal thyroid.   CARDIOVASCULAR: Normal heart rate noted, regular rhythm RESPIRATORY: Clear to auscultation bilaterally. Effort and breath sounds normal, no problems with respiration noted. BREASTS: Symmetric in size. No masses, skin changes, nipple drainage, or lymphadenopathy. ABDOMEN: Soft, normal bowel sounds, no distention noted.  No  tenderness, rebound or guarding.  PELVIC: Normal appearing external genitalia; normal appearing vaginal mucosa and cervix.  No abnormal discharge noted.  Pap smear obtained.  Normal uterine size, no other palpable masses, no uterine or adnexal tenderness. MUSCULOSKELETAL: Normal range of motion. No tenderness.  No cyanosis, clubbing, or edema.  2+ distal pulses. SKIN: Skin is warm and dry. No rash noted. Not diaphoretic. No erythema. No pallor. NEUROLOGIC: Alert and oriented to person, place, and time. Normal reflexes, muscle tone coordination. No cranial nerve deficit noted. PSYCHIATRIC: Normal mood and affect. Normal behavior. Normal judgment and thought content.  Assessment:  Annual gynecologic examination with pap smear   Plan:   1. Annual physical exam Will follow up results of pap smear and manage accordingly. Mammogram scheduled STD testing discussed. Patient requested testing blood and vaginal - Hepatitis B surface antigen - Hepatitis C antibody - HIV antibody - RPR  2. Sebaceous cyst of breast, right benign  Routine preventative health maintenance measures emphasized. Please refer to After Visit Summary for other counseling recommendations.    Candelaria CelesteJacob Stinson, DO [facility]

## 2017-05-16 LAB — HIV ANTIBODY (ROUTINE TESTING W REFLEX): HIV Screen 4th Generation wRfx: NONREACTIVE

## 2017-05-16 LAB — RPR: RPR: NONREACTIVE

## 2017-05-16 LAB — HEPATITIS C ANTIBODY

## 2017-05-16 LAB — HEPATITIS B SURFACE ANTIGEN: HEP B S AG: NEGATIVE

## 2017-05-16 NOTE — Addendum Note (Signed)
Addended by: Jill SideAY, Cruze Zingaro L on: 05/16/2017 09:40 AM   Modules accepted: Orders

## 2017-05-17 LAB — CYTOLOGY - PAP
Chlamydia: NEGATIVE
Diagnosis: NEGATIVE
Neisseria Gonorrhea: NEGATIVE

## 2017-05-22 ENCOUNTER — Telehealth: Payer: Self-pay | Admitting: General Practice

## 2017-05-22 NOTE — Telephone Encounter (Signed)
Patient called and left message requesting results. Called patient & informed her of negative results. Patient verbalized understanding & had no questions

## 2017-06-02 ENCOUNTER — Encounter: Payer: Self-pay | Admitting: Gastroenterology

## 2017-06-02 ENCOUNTER — Ambulatory Visit (INDEPENDENT_AMBULATORY_CARE_PROVIDER_SITE_OTHER): Payer: BLUE CROSS/BLUE SHIELD | Admitting: Gastroenterology

## 2017-06-02 VITALS — BP 102/60 | HR 72 | Ht 67.0 in | Wt 216.0 lb

## 2017-06-02 DIAGNOSIS — K769 Liver disease, unspecified: Secondary | ICD-10-CM | POA: Diagnosis not present

## 2017-06-02 DIAGNOSIS — R1031 Right lower quadrant pain: Secondary | ICD-10-CM

## 2017-06-02 NOTE — Progress Notes (Signed)
     06/02/2017 Michelle DartingJasmine Giovanelli [id number] 03/11/1995   HISTORY OF PRESENT ILLNESS: This is a 22 year old female who was previously seen here in October 2018 by Dr. Lavon PaganiniNandigam for complaints of what seemed to be RUQ abdominal pain at that time.  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  It was recommended that she have follow-up imaging with MRI in 02/2017 but she did not have that performed.  She is here today with ongoing complaints of abdominal pain.  She actually describes the pain as more of right lower quadrant now at this point.  Radiates down to her right groin.  She says that the pain is constant, present all day long, but reaches an intensity of 8 out of 10 on the pain scale at times.  She denies nausea, vomiting, fever, chills.  She says that she has noticed a little bit of constipation recently, sometimes skipping a day or 2.  Says that she believes that she had a CT scan performed when she lived in [place], but we do not have those records.   Past Medical History:  Diagnosis Date  . Asthma   . Hepatic lesion   . Herpes    Past Surgical History:  Procedure Laterality Date  . NO PAST SURGERIES    . WISDOM TOOTH EXTRACTION      reports that she has never smoked. She has never used smokeless tobacco. She reports that she does not drink alcohol or use drugs. family history is not on file. No Known Allergies    Outpatient Encounter Medications as of 06/02/2017  Medication Sig  . cholecalciferol (VITAMIN D) 1000 units tablet Take 1,000 Units by mouth daily.   No facility-administered encounter medications on file as of 06/02/2017.      REVIEW OF SYSTEMS  : All other systems reviewed and negative except where noted in the History of Present Illness.   PHYSICAL EXAM: BP 102/60   Pulse 72   Ht 5\' 7"  (1.702 m)   Wt 216 lb (98 kg)   LMP 05/08/2017 (Exact Date)   BMI 33.83 kg/m  General: Well developed black female in no acute  distress Head: Normocephalic and atraumatic Eyes:  Sclerae anicteric, conjunctiva pink. Ears: Normal auditory acuity Lungs: Clear throughout to auscultation; no M/R/G. Heart: Regular rate and rhythm; no M/R/G. Abdomen: Soft, non-distended.  BS present.  Moderate RLQ TTP. Musculoskeletal: Symmetrical with no gross deformities  Skin: No lesions on visible extremities Extremities: No edema  Neurological: Alert oriented x 4, grossly non-focal Psychological:  Alert and cooperative. Normal mood and affect  ASSESSMENT AND PLAN: *RLQ abdominal pain:  Previously was described as RUQ abdominal pain, but now stating more RLQ.  Reaches 8/10 on the pain scale at times and pain is constant.  Reports having a CT scan in [place], [place] previously but we do not have those records.  Will check CT abdomen and pelvis with contrast. *Liver lesion:  Was supposed to have MRI follow-up in 02/2017 but never had it performed.  We will just re-look at it with CT scan for now since I am ordering that for evaluation of her abdominal pain as well.   CC:  No ref. provider found

## 2017-06-02 NOTE — Patient Instructions (Signed)
If you are age 23 or older, your body mass index should be between 23-30. Your Body mass index is 33.83 kg/m. If this is out of the aforementioned range listed, please consider follow up with your Primary Care Provider.  If you are age 45 or younger, your body mass index should be between 19-25. Your Body mass index is 33.83 kg/m. If this is out of the aformentioned range listed, please consider follow up with your Primary Care Provider.   You have been scheduled for a CT scan of the abdomen and pelvis at [facility] ([address]---this is in the same building as Press [facility]).   You are scheduled on 06/09/17 at 10 am. You should arrive 15 minutes prior to your appointment time for registration. Please follow the written instructions below on the day of your exam:  WARNING: IF YOU ARE ALLERGIC TO IODINE/X-RAY DYE, PLEASE NOTIFY RADIOLOGY IMMEDIATELY AT [phone]! YOU WILL BE GIVEN A 13 HOUR PREMEDICATION PREP.  1) Do not eat after 6 am (4 hours prior to your test) 2) You have been given 2 bottles of oral contrast to drink. The solution may taste better if refrigerated, but do NOT add ice or any other liquid to this solution. Shake well before drinking.    Drink 1 bottle of contrast @ 8 am (2 hours prior to your exam)  Drink 1 bottle of contrast @ 9 am (1 hour prior to your exam)  You may take any medications as prescribed with a small amount of water except for the following: Metformin, Glucophage, Glucovance, Avandamet, Riomet, Fortamet, Actoplus Met, Janumet, Glumetza or Metaglip. The above medications must be held the day of the exam AND 48 hours after the exam.  The purpose of you drinking the oral contrast is to aid in the visualization of your intestinal tract. The contrast solution may cause some diarrhea. Before your exam is started, you will be given a small amount of fluid to drink. Depending on your individual set of symptoms, you may also receive an intravenous  injection of x-ray contrast/dye. Plan on being at [facility] for 30 minutes or longer, depending on the type of exam you are having performed.  This test typically takes 30-45 minutes to complete.  If you have any questions regarding your exam or if you need to reschedule, you may call the CT department at [phone] between the hours of 8:00 am and 5:00 pm, Monday-Friday.  ________________________________________________________________________   Thank you for choosing me and [facility].   Alonza Bogus, PA-C

## 2017-06-05 ENCOUNTER — Other Ambulatory Visit: Payer: Self-pay

## 2017-06-05 ENCOUNTER — Telehealth: Payer: Self-pay | Admitting: Gastroenterology

## 2017-06-05 DIAGNOSIS — R109 Unspecified abdominal pain: Secondary | ICD-10-CM

## 2017-06-05 DIAGNOSIS — K769 Liver disease, unspecified: Secondary | ICD-10-CM

## 2017-06-05 NOTE — Telephone Encounter (Signed)
CT scan rescheduled at [facility] for 06/09/17 at 3:15 .  Pt aware.

## 2017-06-06 NOTE — Telephone Encounter (Signed)
I am not sure about malignancy but she has had ongoing RLQ abdominal pain that reaches an 8/10 on pain scale.  Was looking more to rule out Crohn's, etc.

## 2017-06-06 NOTE — Telephone Encounter (Signed)
Spoke with clinical nurse at [facility] gave additional information you provided and case will go into further review. I will call tomorrow to check status. The nurse did say to rule out Crohn's the patient would need to have had a colonoscopy.

## 2017-06-06 NOTE — Telephone Encounter (Signed)
No, not necessarily.  Colonoscopy does not look at the small intestine.  Please say that as well when you call to check tomorrow if it is not approved,  Thank you

## 2017-06-07 NOTE — Telephone Encounter (Signed)
[facility] info put in system

## 2017-06-07 NOTE — Telephone Encounter (Signed)
Received auth for CT from [facility]

## 2017-06-07 NOTE — Telephone Encounter (Signed)
Awesome! Thank you!

## 2017-06-09 ENCOUNTER — Inpatient Hospital Stay: Admission: RE | Admit: 2017-06-09 | Payer: BLUE CROSS/BLUE SHIELD | Source: Ambulatory Visit

## 2017-06-09 ENCOUNTER — Ambulatory Visit
Admission: RE | Admit: 2017-06-09 | Discharge: 2017-06-09 | Disposition: A | Discharge status: Home / Self Care | Payer: BLUE CROSS/BLUE SHIELD | Source: Ambulatory Visit | Attending: Gastroenterology | Admitting: Gastroenterology

## 2017-06-09 DIAGNOSIS — R109 Unspecified abdominal pain: Secondary | ICD-10-CM

## 2017-06-09 DIAGNOSIS — K769 Liver disease, unspecified: Secondary | ICD-10-CM

## 2017-06-09 MED ORDER — IOPAMIDOL (ISOVUE-300) INJECTION 61%
100.0000 mL | Freq: Once | INTRAVENOUS | Status: AC | PRN
  Administered 2017-06-09: 100 mL via INTRAVENOUS

## 2017-06-09 NOTE — Progress Notes (Signed)
Reviewed and agree with documentation and assessment and plan. K. Veena Nandigam , MD   

## 2017-06-26 ENCOUNTER — Telehealth: Payer: Self-pay | Admitting: Gastroenterology

## 2017-06-26 DIAGNOSIS — K769 Liver disease, unspecified: Secondary | ICD-10-CM

## 2017-06-26 NOTE — Telephone Encounter (Signed)
Zehr, Princella Pellegrini, PA-C  Loretha Stapler, RN        Please let the patient know that he CT scan did not show any cause of her pain. She does still have that lesion on her liver and they are still recommending an MRI abdomen with and without contrast to look at it better. Please schedule if she is agreeable and confirm that she is not on any birth control pills (I don't think that she is but just confirm).   Thank you,   Jess

## 2017-06-26 NOTE — Telephone Encounter (Signed)
The pt has been advised and states she is agreeable to MRI and is NOT on BC pills.    You have been scheduled for an MRI at [facility] on 06/30/17. Your appointment time is 5 pm. Please arrive 30 minutes prior to your appointment time for registration purposes. Please make certain not to have anything to eat or drink 6 hours prior to your test. In addition, if you have any metal in your body, have a pacemaker or defibrillator, please be sure to let your ordering physician know. This test typically takes 45 minutes to 1 hour to complete. Should you need to reschedule, please call [phone] to do so.  The pt has been instructed and all questions answered.

## 2017-06-26 NOTE — Telephone Encounter (Signed)
Pt calling inquiring about her ct scan results.

## 2017-06-30 ENCOUNTER — Telehealth: Payer: Self-pay | Admitting: Gastroenterology

## 2017-06-30 ENCOUNTER — Ambulatory Visit (HOSPITAL_COMMUNITY)
Admission: RE | Admit: 2017-06-30 | Discharge: 2017-06-30 | Disposition: A | Discharge status: Home / Self Care | Payer: BLUE CROSS/BLUE SHIELD | Source: Ambulatory Visit | Attending: Gastroenterology | Admitting: Gastroenterology

## 2017-06-30 DIAGNOSIS — K769 Liver disease, unspecified: Secondary | ICD-10-CM | POA: Diagnosis present

## 2017-06-30 DIAGNOSIS — K7689 Other specified diseases of liver: Secondary | ICD-10-CM | POA: Diagnosis not present

## 2017-06-30 MED ORDER — GADOBENATE DIMEGLUMINE 529 MG/ML IV SOLN
20.0000 mL | Freq: Once | INTRAVENOUS | Status: AC | PRN
  Administered 2017-06-30: 20 mL via INTRAVENOUS

## 2017-07-04 NOTE — Telephone Encounter (Signed)
Left message on machine to call back  

## 2017-07-04 NOTE — Telephone Encounter (Signed)
Shanda Bumps the pt has called looking for her results.  Can you please review?

## 2017-07-13 ENCOUNTER — Other Ambulatory Visit: Payer: Self-pay

## 2017-07-13 DIAGNOSIS — K769 Liver disease, unspecified: Secondary | ICD-10-CM

## 2017-07-14 NOTE — Telephone Encounter (Signed)
Jessica and Dr Lavon PaganiniNandigam,   I received a call from Dr Lowella DandyHenn at IR saying he is not sure the pt needs to have a liver biopsy because the MRI clearly states benign lesion.  He just wants to make sure that everyone is on the same page.  He would like a call back at [phone].  He said he would set it up but wants to make sure its is really needed.  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 I discussed with Dr. Lavon PaganiniNandigam and she would like to have the lesion biopsied to definitively rule that out. Please schedule her for a biopsy with IR if she is agreeable.

## 2017-07-18 NOTE — Telephone Encounter (Signed)
Radiologist said the differential was FNH but couldn't exclude fibrolamellar hepatocellular carcinoma. She does need biopsy. Thanks

## 2017-07-18 NOTE — Telephone Encounter (Signed)
The pt has been scheduled for liver biopsy.

## 2017-07-21 ENCOUNTER — Other Ambulatory Visit: Payer: Self-pay | Admitting: Radiology

## 2017-07-24 ENCOUNTER — Ambulatory Visit (HOSPITAL_COMMUNITY)
Admission: RE | Admit: 2017-07-24 | Discharge: 2017-07-24 | Disposition: A | Discharge status: Home / Self Care | Payer: BLUE CROSS/BLUE SHIELD | Source: Ambulatory Visit | Attending: Gastroenterology | Admitting: Gastroenterology

## 2017-07-24 ENCOUNTER — Encounter (HOSPITAL_COMMUNITY): Payer: Self-pay

## 2017-07-24 DIAGNOSIS — R160 Hepatomegaly, not elsewhere classified: Secondary | ICD-10-CM | POA: Diagnosis present

## 2017-07-24 DIAGNOSIS — K769 Liver disease, unspecified: Secondary | ICD-10-CM | POA: Diagnosis not present

## 2017-07-24 LAB — APTT: aPTT: 29 seconds (ref 24–36)

## 2017-07-24 LAB — PROTIME-INR
INR: 1.02
Prothrombin Time: 13.3 seconds (ref 11.4–15.2)

## 2017-07-24 LAB — CBC
HCT: 38.6 % (ref 36.0–46.0)
HEMOGLOBIN: 12.6 g/dL (ref 12.0–15.0)
MCH: 27.8 pg (ref 26.0–34.0)
MCHC: 32.6 g/dL (ref 30.0–36.0)
MCV: 85 fL (ref 78.0–100.0)
PLATELETS: 250 10*3/uL (ref 150–400)
RBC: 4.54 MIL/uL (ref 3.87–5.11)
RDW: 13.1 % (ref 11.5–15.5)
WBC: 5.1 10*3/uL (ref 4.0–10.5)

## 2017-07-24 MED ORDER — SODIUM CHLORIDE 0.9 % IV SOLN
INTRAVENOUS | Status: DC
  Administered 2017-07-24: 11:00:00 via INTRAVENOUS

## 2017-07-24 MED ORDER — OXYCODONE HCL 5 MG PO TABS
10.0000 mg | ORAL_TABLET | Freq: Once | ORAL | Status: AC
  Administered 2017-07-24: 5 mg via ORAL
  Filled 2017-07-24 (×2): qty 2

## 2017-07-24 MED ORDER — GELATIN ABSORBABLE 12-7 MM EX MISC
CUTANEOUS | Status: AC
  Filled 2017-07-24: qty 1

## 2017-07-24 MED ORDER — FENTANYL CITRATE (PF) 100 MCG/2ML IJ SOLN
INTRAMUSCULAR | Status: AC | PRN
  Administered 2017-07-24 (×2): 50 ug via INTRAVENOUS

## 2017-07-24 MED ORDER — FENTANYL CITRATE (PF) 100 MCG/2ML IJ SOLN
50.0000 ug | Freq: Once | INTRAMUSCULAR | Status: AC
  Administered 2017-07-24: 50 ug via INTRAVENOUS
  Filled 2017-07-24: qty 2

## 2017-07-24 MED ORDER — LIDOCAINE-EPINEPHRINE (PF) 2 %-1:200000 IJ SOLN
INTRAMUSCULAR | Status: AC
  Filled 2017-07-24: qty 20

## 2017-07-24 MED ORDER — MIDAZOLAM HCL 2 MG/2ML IJ SOLN
INTRAMUSCULAR | Status: AC | PRN
  Administered 2017-07-24 (×3): 1 mg via INTRAVENOUS

## 2017-07-24 MED ORDER — FENTANYL CITRATE (PF) 100 MCG/2ML IJ SOLN
INTRAMUSCULAR | Status: AC
  Administered 2017-07-24: 50 ug via INTRAVENOUS
  Filled 2017-07-24: qty 2

## 2017-07-24 MED ORDER — MIDAZOLAM HCL 2 MG/2ML IJ SOLN
INTRAMUSCULAR | Status: DC
Start: 2017-07-24 — End: 2017-07-25
  Filled 2017-07-24: qty 2

## 2017-07-24 MED ORDER — MIDAZOLAM HCL 2 MG/2ML IJ SOLN
INTRAMUSCULAR | Status: AC
  Filled 2017-07-24: qty 2

## 2017-07-24 NOTE — Consult Note (Signed)
Chief Complaint: Patient was seen in consultation today for image guided liver lesion biopsy  Referring Physician(s): Zehr,Jessica D/Nandigam,K  Supervising Physician: Simonne ComeWatts, John  [facility]  History of Present Illness: Michelle DartingJasmine Cole is a 22 y.o. 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  She presents today for image guided liver lesion biopsy for further evaluation. She has no prior history of cancer.   Past Medical History:  Diagnosis Date  . Asthma   . Hepatic lesion   . Herpes     Past Surgical History:  Procedure Laterality Date  . NO PAST SURGERIES    . WISDOM TOOTH EXTRACTION      Allergies: Patient has no known allergies.  Medications: Prior to Admission medications   Medication Sig Start Date End Date Taking? Authorizing Provider  cholecalciferol (VITAMIN D) 1000 units tablet Take 1,000 Units by mouth daily.    [provider]     History reviewed. No pertinent family history.  Social History   Socioeconomic History  . Marital status: Single    Spouse name: Not on file  . Number of children: Not on file  . Years of education: Not on file  . Highest education level: Not on file  Occupational History  . Not on file  Social Needs  . Financial resource strain: Not on file  . Food insecurity:    Worry: Not on file    Inability: Not on file  . Transportation needs:    Medical: Not on file    Non-medical: Not on file  Tobacco Use  . Smoking status: Never Smoker  . Smokeless tobacco: Never Used  Substance and Sexual Activity  . Alcohol use: No  . Drug use: No  . Sexual activity: Yes    Birth control/protection: None  Lifestyle  . Physical activity:    Days per week: Not on file    Minutes per session: Not on file  . Stress: Not on file  Relationships  . Social connections:    Talks on phone: Not on  file    Gets together: Not on file    Attends religious service: Not on file    Active member of club or organization: Not on file    Attends meetings of clubs or organizations: Not on file    Relationship status: Not on file  Other Topics Concern  . Not on file  Social History Narrative  . Not on file     Review of Systems currently denies fever, chest pain, dyspnea, cough, back pain, nausea, vomiting or bleeding.  She does have occasional headaches and abdominal discomfort as mentioned above.  Vital Signs: BP 103/65 (BP Location: Right Arm)   Pulse 64   Temp 98.2 F (36.8 C) (Oral)   Resp 18   LMP 07/24/2017   SpO2 100%   Physical Exam awake, alert.  Chest clear to auscultation bilaterally.  Heart with regular rate and rhythm.  Abdomen soft, positive bowel sounds, mildly tender right sided abdominal discomfort to palpation.  No lower extremity edema.  Imaging: Mr Abdomen W Wo Contrast  Result Date: 07/01/2017 CLINICAL DATA:  Right hepatic lobe mass seen on CT scan for further characterization. EXAM: MRI ABDOMEN WITHOUT AND WITH CONTRAST TECHNIQUE: Multiplanar multisequence MR imaging of the abdomen was performed both before and after the administration of intravenous contrast. CONTRAST:  20mL MULTIHANCE GADOBENATE DIMEGLUMINE 529 MG/ML IV SOLN COMPARISON:  06/09/2017 FINDINGS: Lower chest: Unremarkable Hepatobiliary: Arterial phase enhancing 4.2 by 3.1 by 3.2 cm mass primarily in segment 5 of the liver is essentially isoenhancing on the other phases. Central scarring with high T2 signal component and some delayed enhancement along the scar. The lesion has very minimally reduced precontrast T1 signal characteristics. Faint adjacent transient hepatic attenuation difference. There is a similar 0.7 cm focus of arterial phase enhancement on image 73/1001 inferiorly in the right hepatic lobe which is relatively in apparent on other sequences add not appreciable prior to contrast  administration. Another 7 mm focus of arterial phase enhancement is present in segment 4 of the liver on image 31/1001, and not well seen on other phases/sequences. 6 mm focus of arterial phase enhancement in the lateral segment left hepatic lobe on image 35/1001, once again not appreciable on other phases or sequences. In the right hepatic lobe on image 37/1001, a 0.9 by 0.6 cm focus of arterial phase enhancement has similar imaging characteristics. None of these lesions demonstrates a significant degree of restriction of diffusion. Gallbladder unremarkable. Pancreas:  Unremarkable Spleen:  Unremarkable Adrenals/Urinary Tract:  Unremarkable Stomach/Bowel: Unremarkable Vascular/Lymphatic:  Unremarkable Other:  No supplemental non-categorized findings. Musculoskeletal: Unremarkable IMPRESSION: 1. 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 The dominant lesion measures 4.3 cm in long axis and has a classic appearance of central scar. In terms of differentiation from fibrolamellar hepatocellular carcinoma, the indistinctness of the lesion on T1 and T2 sequences, the small size, the lack of calcification on CT, and lack of other findings such as nodal involvement, strongly favor benign FNH. 2. For additional lesions in the liver likely represent smaller foci of FNH based on imaging characteristics. 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   On: 07/01/2017 09:12    Labs:  CBC: Recent Labs    11/09/16 1510 07/24/17 1120  WBC 6.4 5.1  HGB 11.8* 12.6  HCT 36.0 38.6  PLT 226.0 250    COAGS: Recent Labs    11/09/16 1510  INR 1.2*    BMP: Recent Labs    11/09/16 1510  NA 137  K 3.8  CL 103  CO2 29  GLUCOSE 108*  BUN 10  CALCIUM 9.0  CREATININE 0.79    LIVER FUNCTION TESTS: Recent Labs      11/09/16 1510  BILITOT 0.2  AST 11  ALT 7  ALKPHOS 50  PROT 7.5  ALBUMIN 3.9    TUMOR MARKERS: Recent Labs    11/09/16 1510  AFPTM 1.1    Assessment and Plan: 22 y.o. 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  She presents today for image guided liver lesion biopsy for further evaluation. Risks and benefits discussed with the patient/sister including, but not limited to bleeding, infection, damage to adjacent structures or low yield requiring additional tests.  All of the patient's questions were answered, patient is agreeable to proceed. Consent signed and in chart.     Thank you for this interesting consult.  I greatly enjoyed meeting Michelle Cole and look forward to participating in their care.  A copy of this report was sent to the requesting provider on this date.  Electronically Signed: D. Jeananne Rama, PA-C 07/24/2017, 11:36 AM   I spent a total of  25 minutes   in face to face in clinical consultation, greater than 50% of which was counseling/coordinating care for image guided liver lesion biopsy

## 2017-07-24 NOTE — Procedures (Signed)
Pre Procedure Dx: Liver mass Post Procedural Dx: Same  Technically successful US guided biopsy of indeterminate mass within the right lobe of the liver.   EBL: None  No immediate complications.   Jay Wendal Wilkie, MD Pager #: [phone]    

## 2017-07-24 NOTE — Discharge Instructions (Signed)
Liver Biopsy, Care After °These instructions give you information on caring for yourself after your procedure. Your doctor may also give you more specific instructions. Call your doctor if you have any problems or questions after your procedure. °Follow these instructions at home: °· Rest at home for 1-2 days or as told by your doctor. °· Have someone stay with you for at least 24 hours. °· Do not do these things in the first 24 hours: °? Drive. °? Use machinery. °? Take care of other people. °? Sign legal documents. °? Take a bath or shower. °· There are many different ways to close and cover a cut (incision). For example, a cut can be closed with stitches, skin glue, or adhesive strips. Follow your doctor's instructions on: °? Taking care of your cut. °? Changing and removing your bandage (dressing). °? Removing whatever was used to close your cut. °· Do not drink alcohol in the first week. °· Do not lift more than 5 pounds or play contact sports for the first 2 weeks. °· Take medicines only as told by your doctor. For 1 week, do not take medicine that has aspirin in it or medicines like ibuprofen. °· Get your test results. °Contact a doctor if: °· A cut bleeds and leaves more than just a small spot of blood. °· A cut is red, puffs up (swells), or hurts more than before. °· Fluid or something else comes from a cut. °· A cut smells bad. °· You have a fever or chills. °Get help right away if: °· You have swelling, bloating, or pain in your belly (abdomen). °· You get dizzy or faint. °· You have a rash. °· You feel sick to your stomach (nauseous) or throw up (vomit). °· You have trouble breathing, feel short of breath, or feel faint. °· Your chest hurts. °· You have problems talking or seeing. °· You have trouble balancing or moving your arms or legs. °This information is not intended to replace advice given to you by your health care provider. Make sure you discuss any questions you have with your health care  provider. °Document Released: 11/03/2007 Document Revised: 07/02/2015 Document Reviewed: 03/22/2013 °Elsevier Interactive Patient Education © 2018 [employer] ° ° ° ° °Moderate Conscious Sedation, Adult, Care After °These instructions provide you with information about caring for yourself after your procedure. Your health care provider may also give you more specific instructions. Your treatment has been planned according to current medical practices, but problems sometimes occur. Call your health care provider if you have any problems or questions after your procedure. °What can I expect after the procedure? °After your procedure, it is common: °· To feel sleepy for several hours. °· To feel clumsy and have poor balance for several hours. °· To have poor judgment for several hours. °· To vomit if you eat too soon. ° °Follow these instructions at home: °For at least 24 hours after the procedure: ° °· Do not: °? Participate in activities where you could fall or become injured. °? Drive. °? Use heavy machinery. °? Drink alcohol. °? Take sleeping pills or medicines that cause drowsiness. °? Make important decisions or sign legal documents. °? Take care of children on your own. °· Rest. °Eating and drinking °· Follow the diet recommended by your health care provider. °· If you vomit: °? Drink water, juice, or soup when you can drink without vomiting. °? Make sure you have little or no nausea before eating solid foods. °General instructions °·   Have a responsible adult stay with you until you are awake and alert. °· Take over-the-counter and prescription medicines only as told by your health care provider. °· If you smoke, do not smoke without supervision. °· Keep all follow-up visits as told by your health care provider. This is important. °Contact a health care provider if: °· You keep feeling nauseous or you keep vomiting. °· You feel light-headed. °· You develop a rash. °· You have a fever. °Get help right away  if: °· You have trouble breathing. °This information is not intended to replace advice given to you by your health care provider. Make sure you discuss any questions you have with your health care provider. °Document Released: 11/14/2012 Document Revised: 06/29/2015 Document Reviewed: 05/16/2015 °Elsevier Interactive Patient Education © 2018 [employer] ° ° °

## 2017-07-28 ENCOUNTER — Telehealth: Payer: Self-pay | Admitting: Gastroenterology

## 2017-07-28 NOTE — Telephone Encounter (Signed)
Hi Jessica, have you seen the liver biopsy results yet. Thanks.

## 2017-08-02 NOTE — Telephone Encounter (Signed)
Please advise 

## 2017-08-02 NOTE — Telephone Encounter (Signed)
I see where [name] asked you about the results, but the actual results have not not been reviewed from what I can see.

## 2017-08-02 NOTE — Telephone Encounter (Signed)
Looks like Michelle FanningJulie gave her the results on 6/21.

## 2017-08-02 NOTE — Telephone Encounter (Signed)
If you click on the pathology, then it says that the results were reviewed with the patient.  Signed by [name] on 6/21.

## 2017-08-03 NOTE — Telephone Encounter (Signed)
Sorry Shanda BumpsJessica I looked and didn't see that and I also called [name] and she didn't remember calling.  Thanks.      Notes recorded by Leverne HumblesFournier, Julia M, RN on 07/28/2017 at 3:58 PM EDT Patient advised of results and recommendations. ------  Notes recorded by Leta BaptistZehr, Jessica D, PA-C on 07/28/2017 at 3:06 PM EDT Please let her know that the biopsies were positive for focal nodular hyperplasia, no malignancy. No further evaluation needed, but she should not take any type of hormone replacement including contraceptive pills.  Thank you,  Jess

## 2017-11-27 ENCOUNTER — Encounter (HOSPITAL_COMMUNITY): Payer: Self-pay | Admitting: Emergency Medicine

## 2017-11-27 ENCOUNTER — Ambulatory Visit (HOSPITAL_COMMUNITY)
Admission: EM | Admit: 2017-11-27 | Discharge: 2017-11-27 | Disposition: A | Discharge status: Home / Self Care | Payer: BLUE CROSS/BLUE SHIELD | Attending: Family Medicine | Admitting: Family Medicine

## 2017-11-27 DIAGNOSIS — Z79899 Other long term (current) drug therapy: Secondary | ICD-10-CM | POA: Diagnosis not present

## 2017-11-27 DIAGNOSIS — N760 Acute vaginitis: Secondary | ICD-10-CM

## 2017-11-27 LAB — POCT URINALYSIS DIP (DEVICE)
Bilirubin Urine: NEGATIVE
GLUCOSE, UA: NEGATIVE mg/dL
HGB URINE DIPSTICK: NEGATIVE
Ketones, ur: NEGATIVE mg/dL
Leukocytes, UA: NEGATIVE
NITRITE: NEGATIVE
Protein, ur: NEGATIVE mg/dL
Specific Gravity, Urine: 1.015 (ref 1.005–1.030)
Urobilinogen, UA: 0.2 mg/dL (ref 0.0–1.0)
pH: 7 (ref 5.0–8.0)

## 2017-11-27 LAB — POCT PREGNANCY, URINE: Preg Test, Ur: NEGATIVE

## 2017-11-27 MED ORDER — FLUCONAZOLE 150 MG PO TABS: ORAL_TABLET | ORAL | 0 refills | Status: AC

## 2017-11-27 NOTE — ED Provider Notes (Signed)
[facility]    CSN: [id number] Arrival date & time: 11/27/17  9562     History   Chief Complaint Chief Complaint  Patient presents with  . Dysuria  . Vaginal Discharge    HPI Michelle Cole is a 22 y.o. female.   Michelle Cole presents with complaints of vaginal discharge and itching which started three days ago. White discharge. No pain. No bleeding. No odor. States has discomfort with urination when she first voids in the AM. Otherwise no dysuria, no frequency. No blood in urine. LMP 9/18. Has had similar in the past and was yeast. Some low back pain, she attributes to over use at work. She is not sexually active. Denies any vulvar lesions or sores. Hx of asthma, hepatic lesion, herpes.    ROS per HPI.      Past Medical History:  Diagnosis Date  . Asthma   . Hepatic lesion   . Herpes     Patient Active Problem List   Diagnosis Date Noted  . Liver lesion 06/02/2017  . RLQ abdominal pain 06/02/2017    Past Surgical History:  Procedure Laterality Date  . NO PAST SURGERIES    . WISDOM TOOTH EXTRACTION      OB History    Gravida  0   Para  0   Term  0   Preterm  0   AB  0   Living  0     SAB  0   TAB  0   Ectopic  0   Multiple  0   Live Births               Home Medications    Prior to Admission medications   Medication Sig Start Date End Date Taking? Authorizing Provider  cholecalciferol (VITAMIN D) 1000 units tablet Take 1,000 Units by mouth daily.   Yes [provider]  fluconazole (DIFLUCAN) 150 MG tablet Take 1 tablet today. If still with symptoms may repeat in 3 days. 11/27/17   Georgetta Haber, NP    Family History History reviewed. No pertinent family history.  Social History Social History   Tobacco Use  . Smoking status: Never Smoker  . Smokeless tobacco: Never Used  Substance Use Topics  . Alcohol use: No  . Drug use: No     Allergies   Patient has no known allergies.   Review of  Systems Review of Systems   Physical Exam Triage Vital Signs ED Triage Vitals  Enc Vitals Group     BP 11/27/17 0911 100/68     Pulse Rate 11/27/17 0911 73     Resp 11/27/17 0911 16     Temp 11/27/17 0911 97.7 F (36.5 C)     Temp Source 11/27/17 0911 Oral     SpO2 11/27/17 0911 97 %     Weight --      Height --      Head Circumference --      Peak Flow --      Pain Score 11/27/17 0910 0     Pain Loc --      Pain Edu? --      Excl. in GC? --    No data found.  Updated Vital Signs BP 100/68   Pulse 73   Temp 97.7 F (36.5 C) (Oral)   Resp 16   LMP 10/25/2017   SpO2 97%     Physical Exam  Constitutional: She is oriented to person, place, and time.  She appears well-developed and well-nourished. No distress.  Cardiovascular: Normal rate, regular rhythm and normal heart sounds.  Pulmonary/Chest: Effort normal and breath sounds normal.  Abdominal: Soft. There is no tenderness. There is no rigidity, no rebound, no guarding and no CVA tenderness.  Genitourinary:  Genitourinary Comments: Denies sores, lesions, vaginal bleeding; no pelvic pain; gu exam deferred at this time, vaginal self swab collected.    Neurological: She is alert and oriented to person, place, and time.  Skin: Skin is warm and dry.     UC Treatments / Results  Labs (all labs ordered are listed, but only abnormal results are displayed) Labs Reviewed  POCT URINALYSIS DIP (DEVICE)  POCT PREGNANCY, URINE  CERVICOVAGINAL ANCILLARY ONLY    EKG None  Radiology No results found.  Procedures Procedures (including critical care time)  Medications Ordered in UC Medications - No data to display  Initial Impression / Assessment and Plan / UC Course  I have reviewed the triage vital signs and the nursing notes.  Pertinent labs & imaging results that were available during my care of the patient were reviewed by me and considered in my medical decision making (see chart for details).     Started  diflucan today. Vaginal cytology collected and pending. Will notify of any positive findings and if any changes to treatment are needed.  If symptoms worsen or do not improve in the next week to return to be seen or to follow up with PCP.  Patient verbalized understanding and agreeable to plan.   Final Clinical Impressions(s) / UC Diagnoses   Final diagnoses:  Acute vaginitis     Discharge Instructions     We will start treatment for yeast.  Will notify you of any positive findings and if any changes to treatment are needed.   If symptoms worsen or do not improve in the next week to return to be seen or to follow up with your PCP.     ED Prescriptions    Medication Sig Dispense Auth. Provider   fluconazole (DIFLUCAN) 150 MG tablet Take 1 tablet today. If still with symptoms may repeat in 3 days. 2 tablet Georgetta Haber, NP     Controlled Substance Prescriptions [place] Controlled Substance Registry consulted? Not Applicable   Georgetta Haber, NP 11/27/17 [phone]

## 2017-11-27 NOTE — Discharge Instructions (Signed)
We will start treatment for yeast.  Will notify you of any positive findings and if any changes to treatment are needed.   If symptoms worsen or do not improve in the next week to return to be seen or to follow up with your PCP.

## 2017-11-27 NOTE — ED Triage Notes (Signed)
PT reports vaginal discharge and itching for 3 days. Also reports dysuria that is usually worst in the AM

## 2017-11-28 LAB — CERVICOVAGINAL ANCILLARY ONLY
Bacterial vaginitis: NEGATIVE
CANDIDA VAGINITIS: NEGATIVE
Chlamydia: NEGATIVE
NEISSERIA GONORRHEA: NEGATIVE
TRICH (WINDOWPATH): NEGATIVE

## 2018-02-24 ENCOUNTER — Encounter (HOSPITAL_COMMUNITY): Payer: Self-pay

## 2018-02-24 ENCOUNTER — Other Ambulatory Visit: Payer: Self-pay

## 2018-02-24 ENCOUNTER — Ambulatory Visit (HOSPITAL_COMMUNITY)
Admission: EM | Admit: 2018-02-24 | Discharge: 2018-02-24 | Disposition: A | Discharge status: Home / Self Care | Payer: BLUE CROSS/BLUE SHIELD | Attending: Family Medicine | Admitting: Family Medicine

## 2018-02-24 DIAGNOSIS — B309 Viral conjunctivitis, unspecified: Secondary | ICD-10-CM | POA: Insufficient documentation

## 2018-02-24 DIAGNOSIS — J069 Acute upper respiratory infection, unspecified: Secondary | ICD-10-CM | POA: Insufficient documentation

## 2018-02-24 LAB — POCT RAPID STREP A: Streptococcus, Group A Screen (Direct): NEGATIVE

## 2018-02-24 MED ORDER — POLYMYXIN B-TRIMETHOPRIM 10000-0.1 UNIT/ML-% OP SOLN: 1.0000 [drp] | OPHTHALMIC | 0 refills | Status: AC

## 2018-02-24 MED ORDER — CETIRIZINE HCL 10 MG PO TABS: 10.0000 mg | ORAL_TABLET | Freq: Every day | ORAL | 0 refills | Status: AC

## 2018-02-24 NOTE — ED Provider Notes (Signed)
[facility]    CSN: [id number] Arrival date & time: 02/24/18  1346     History   Chief Complaint Chief Complaint  Patient presents with  . Sore Throat  . Conjunctivitis    HPI Michelle Cole is a 23 y.o. female.   Patient is a 23 year old female with past medical history of asthma who presents with cough, congestion, sore throat x3 days.  Over the past 24 hours she has developed left eye redness, green drainage, itching and irritation.  She has had some recent sick contacts with family.  She denies any fever, chills, body aches, night sweats.  No recent traveling.  ROS per HPI      Past Medical History:  Diagnosis Date  . Asthma   . Hepatic lesion   . Herpes     Patient Active Problem List   Diagnosis Date Noted  . Liver lesion 06/02/2017  . RLQ abdominal pain 06/02/2017    Past Surgical History:  Procedure Laterality Date  . NO PAST SURGERIES    . WISDOM TOOTH EXTRACTION      OB History    Gravida  0   Para  0   Term  0   Preterm  0   AB  0   Living  0     SAB  0   TAB  0   Ectopic  0   Multiple  0   Live Births               Home Medications    Prior to Admission medications   Medication Sig Start Date End Date Taking? Authorizing Provider  cetirizine (ZYRTEC) 10 MG tablet Take 1 tablet (10 mg total) by mouth daily. 02/24/18   Dahlia ByesBast, Jethro Radke A, NP  cholecalciferol (VITAMIN D) 1000 units tablet Take 1,000 Units by mouth daily.    [provider]  fluconazole (DIFLUCAN) 150 MG tablet Take 1 tablet today. If still with symptoms may repeat in 3 days. Patient not taking: Reported on 02/24/2018 11/27/17   Linus MakoBurky, Natalie B, NP  trimethoprim-polymyxin b (POLYTRIM) ophthalmic solution Place 1 drop into the left eye every 4 (four) hours. 02/24/18   Janace ArisBast, Jeovany Huitron A, NP    Family History History reviewed. No pertinent family history.  Social History Social History   Tobacco Use  . Smoking status: Never Smoker  .  Smokeless tobacco: Never Used  Substance Use Topics  . Alcohol use: No  . Drug use: No     Allergies   Patient has no known allergies.   Review of Systems Review of Systems   Physical Exam Triage Vital Signs ED Triage Vitals  Enc Vitals Group     BP 02/24/18 1514 109/76     Pulse Rate 02/24/18 1514 68     Resp 02/24/18 1514 18     Temp 02/24/18 1514 97.9 F (36.6 C)     Temp Source 02/24/18 1514 Oral     SpO2 02/24/18 1514 100 %     Weight 02/24/18 1512 212 lb (96.2 kg)     Height --      Head Circumference --      Peak Flow --      Pain Score 02/24/18 1512 3     Pain Loc --      Pain Edu? --      Excl. in GC? --    No data found.  Updated Vital Signs BP 109/76 (BP Location: Right Arm)   Pulse  68   Temp 97.9 F (36.6 C) (Oral)   Resp 18   Wt 212 lb (96.2 kg)   LMP 02/07/2018   SpO2 100%   BMI 33.20 kg/m   Visual Acuity Right Eye Distance:   Left Eye Distance:   Bilateral Distance:    Right Eye Near:   Left Eye Near:    Bilateral Near:     Physical Exam Vitals signs and nursing note reviewed.  Constitutional:      General: She is not in acute distress.    Appearance: She is well-developed.  HENT:     Head: Normocephalic and atraumatic.     Right Ear: Tympanic membrane and ear canal normal.     Left Ear: Tympanic membrane and ear canal normal.     Mouth/Throat:     Pharynx: Posterior oropharyngeal erythema present.     Tonsils: No tonsillar exudate. Swelling: 1+ on the right. 1+ on the left.  Eyes:     Conjunctiva/sclera:     Right eye: Right conjunctiva is not injected. No exudate.    Left eye: Left conjunctiva is injected. Exudate present.  Neck:     Musculoskeletal: Neck supple.  Cardiovascular:     Rate and Rhythm: Normal rate and regular rhythm.     Heart sounds: No murmur.  Pulmonary:     Effort: Pulmonary effort is normal. No respiratory distress.     Breath sounds: Normal breath sounds.  Abdominal:     Palpations: Abdomen is  soft.     Tenderness: There is no abdominal tenderness.  Skin:    General: Skin is warm and dry.  Neurological:     Mental Status: She is alert.      UC Treatments / Results  Labs (all labs ordered are listed, but only abnormal results are displayed) Labs Reviewed  CULTURE, GROUP A STREP [facility])  POCT RAPID STREP A    EKG None  Radiology No results found.  Procedures Procedures (including critical care time)  Medications Ordered in UC Medications - No data to display  Initial Impression / Assessment and Plan / UC Course  I have reviewed the triage vital signs and the nursing notes.  Pertinent labs & imaging results that were available during my care of the patient were reviewed by me and considered in my medical decision making (see chart for details).     Most likely viral illness We will have her do over-the-counter symptomatic treatment Recommendations given We will also give Polytrim for left eye Follow up as needed for continued or worsening symptoms  Final Clinical Impressions(s) / UC Diagnoses   Final diagnoses:  Viral upper respiratory tract infection  Acute viral conjunctivitis of left eye     Discharge Instructions     Your rapid strep screen was negative.  Will send for culture. I believe that you have a viral upper respiratory infection You can do over-the-counter medication to help with the symptoms Warm salt water gargles Zyrtec is good for drainage and congestion I am going to send some eyedrops to the pharmacy to help with the conjunctivitis. Follow up as needed for continued or worsening symptoms     ED Prescriptions    Medication Sig Dispense Auth. Provider   trimethoprim-polymyxin b (POLYTRIM) ophthalmic solution Place 1 drop into the left eye every 4 (four) hours. 10 mL Carmina Walle A, NP   cetirizine (ZYRTEC) 10 MG tablet Take 1 tablet (10 mg total) by mouth daily. 30 tablet Montell Leopard A,  NP     Controlled Substance  Prescriptions [place] Controlled Substance Registry consulted? Not Applicable   Janace ArisBast, Zohaib Heeney A, NP 02/24/18 1546

## 2018-02-24 NOTE — Discharge Instructions (Addendum)
Your rapid strep screen was negative.  Will send for culture. I believe that you have a viral upper respiratory infection You can do over-the-counter medication to help with the symptoms Warm salt water gargles Zyrtec is good for drainage and congestion I am going to send some eyedrops to the pharmacy to help with the conjunctivitis. Follow up as needed for continued or worsening symptoms

## 2018-02-24 NOTE — ED Triage Notes (Signed)
Pt cc she states she has pink eye left eye and a sore throat. X 3 days.

## 2018-02-27 LAB — CULTURE, GROUP A STREP (THRC)

## 2019-02-10 IMAGING — US US BIOPSY CORE LIVER
1 series · 13 of 17 positions shown · non-contrast
Comparison: Abdominal MRI - 06/30/2017;

INDICATION: Indeterminate liver lesions/masses suggestive of multifocal FNH.
Request made for ultrasound-guided liver mass biopsy for tissue
diagnostic purposes.

EXAM:
ULTRASOUND GUIDED LIVER LESION BIOPSY

[Series 1: us biopsy core liver · 13 of 17 slices shown]
[im 1/17]
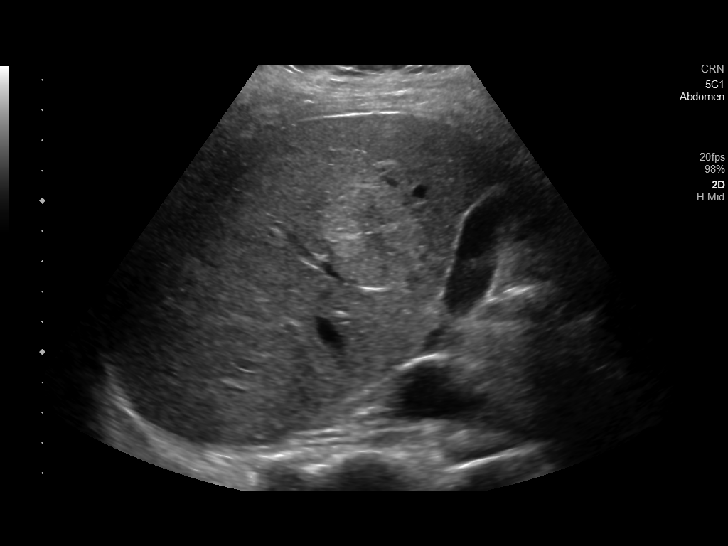
[im 2/17]
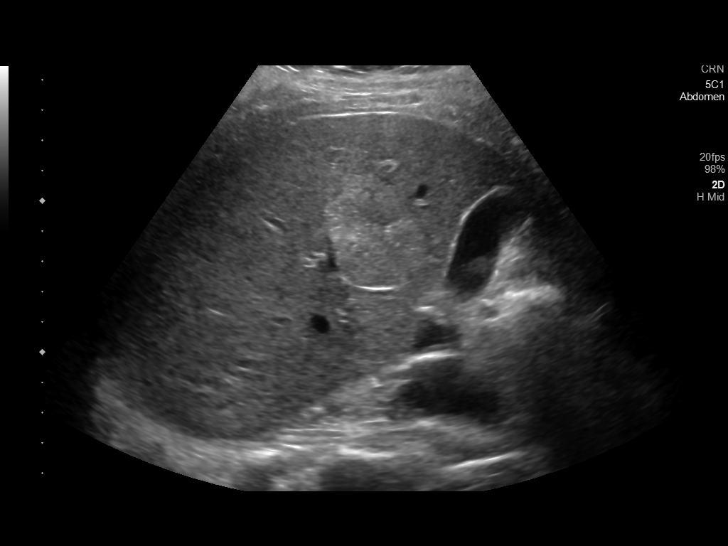
[im 4/17]
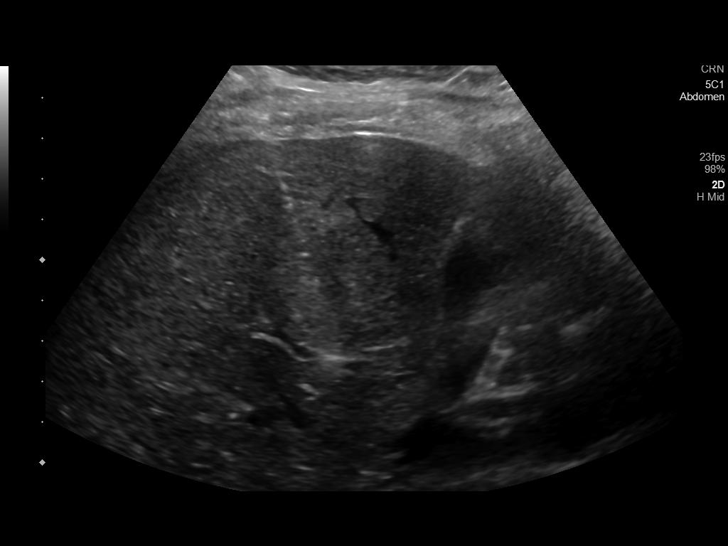
[im 5/17]
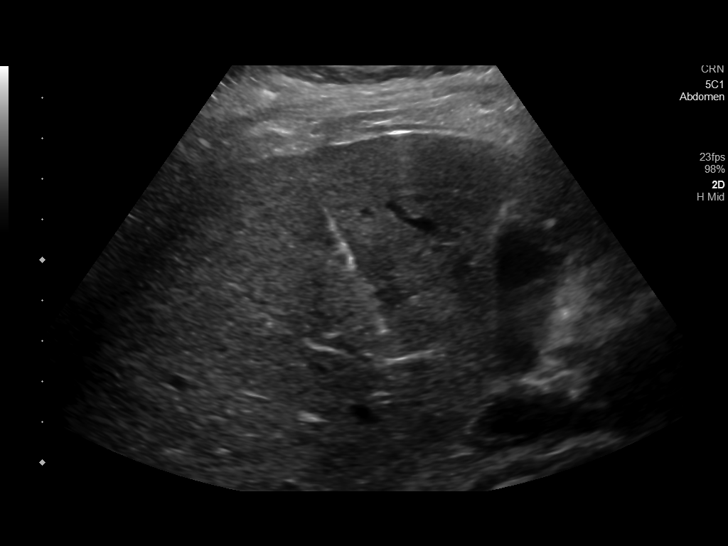
[im 6/17]
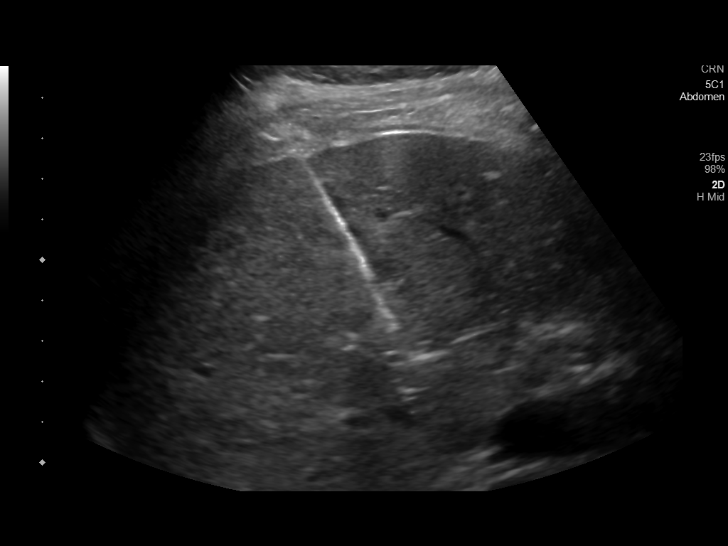
[im 8/17]
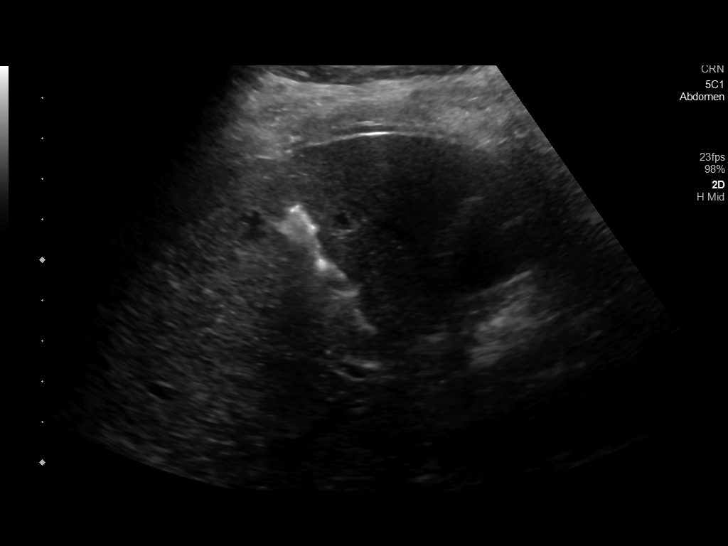
[im 9/17]
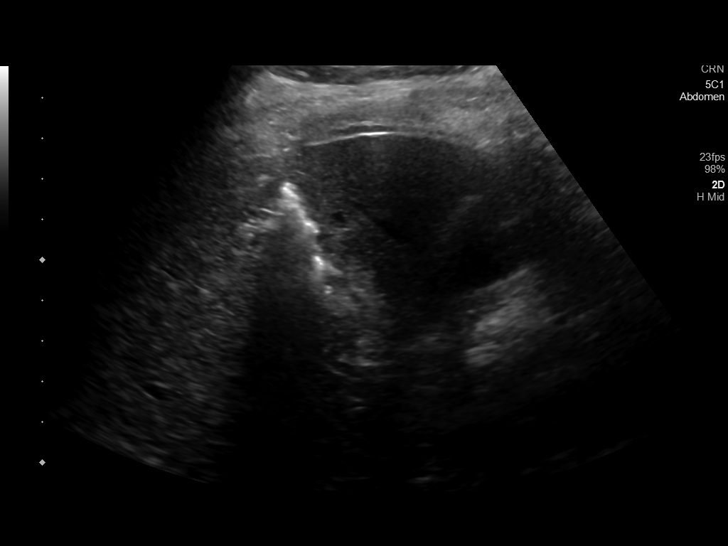
[im 10/17]
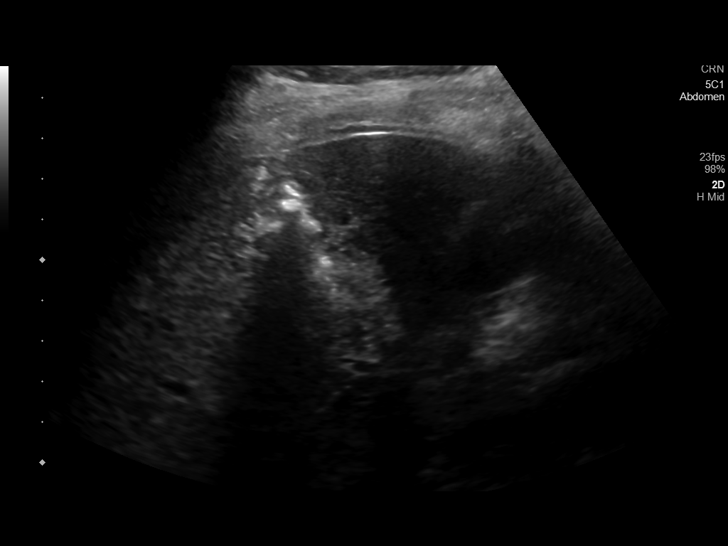
[im 12/17]
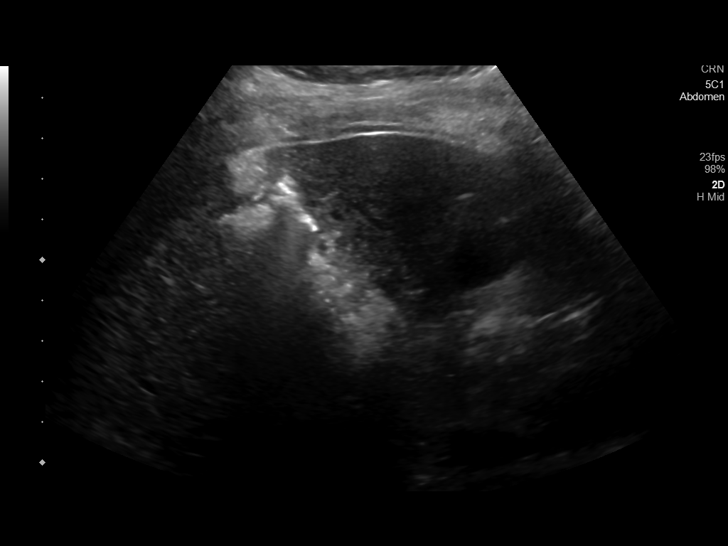
[im 13/17]
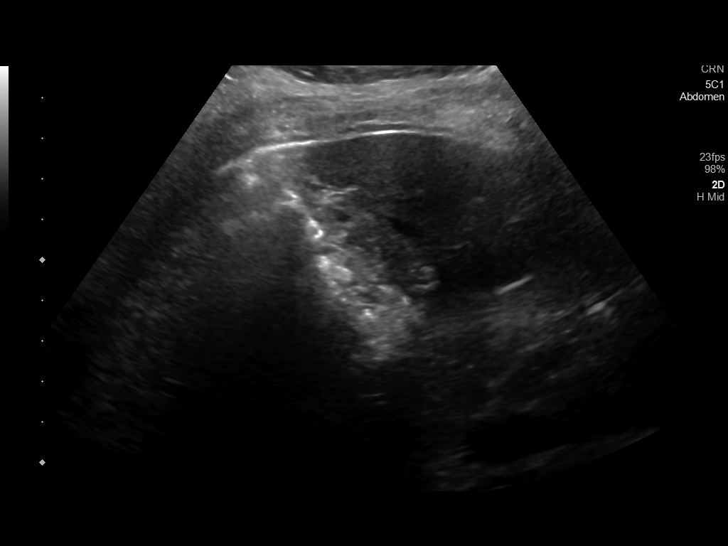
[im 14/17]
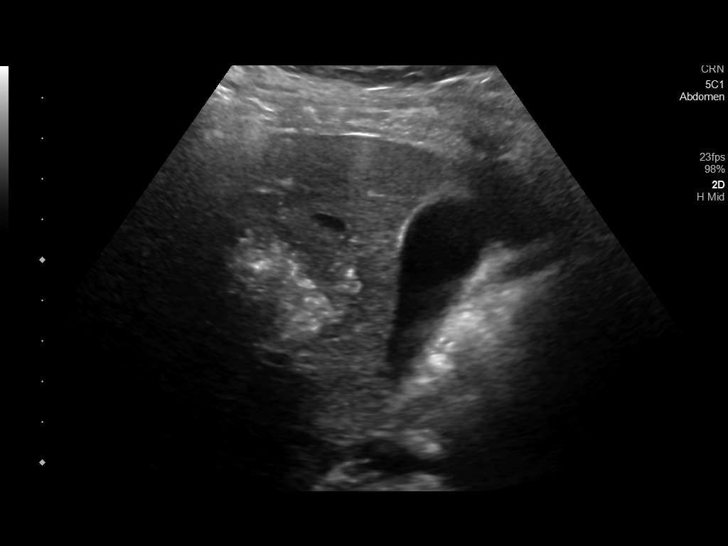
[im 16/17]
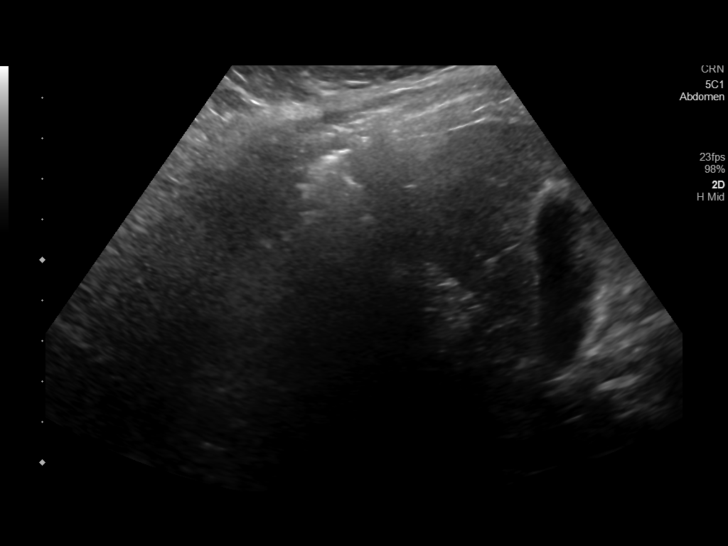
[im 17/17]
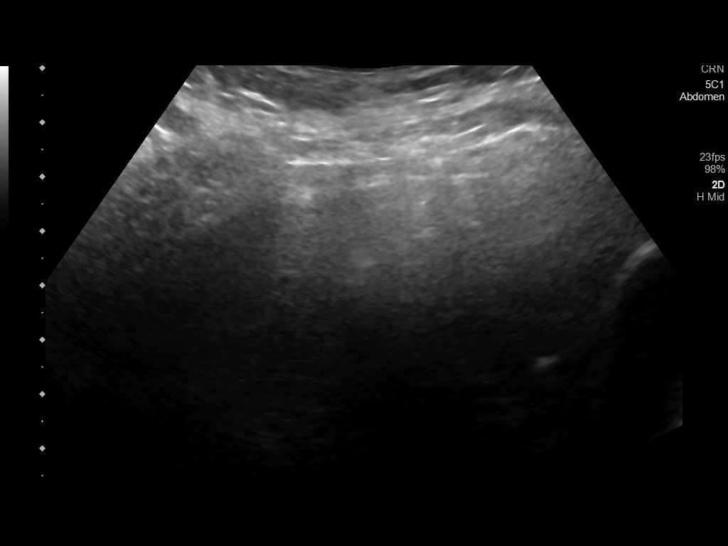

[13 of 17 positions shown; findings below may reference images not displayed]

CT abdomen and pelvis -
06/09/2017

MEDICATIONS:
None

ANESTHESIA/SEDATION:
Fentanyl 100 mcg IV; Versed 3 mg IV

Total Moderate Sedation time:  14 Minutes.

The patient's level of consciousness and vital signs were monitored
continuously by radiology nursing throughout the procedure under my
direct supervision.

COMPLICATIONS:
None immediate.

PROCEDURE:
Informed written consent was obtained from the patient after a
discussion of the risks, benefits and alternatives to treatment. The
patient understands and consents the procedure. A timeout was
performed prior to the initiation of the procedure.

Ultrasound scanning was performed of the right upper abdominal
quadrant demonstrates an approximately 3.5 x 3.1 cm mass within the
caudal aspect the right lobe of the liver compatible with the
dominant mass seen on preceding abdominal CT (image 23, series 2)
and confirmed on subsequent abdominal MRI performed 06/30/2017. The
procedure was planned.

The right upper abdominal quadrant was prepped and draped in the
usual sterile fashion. The overlying soft tissues were anesthetized
with 1% lidocaine with epinephrine. A 17 gauge, 6.8 cm co-axial
needle was advanced into a peripheral aspect of the lesion. This was
followed by 3 core biopsies with an 18 gauge core device under
direct ultrasound guidance.

The coaxial needle tract was embolized with a small amount of
Gel-Foam slurry and superficial hemostasis was obtained with manual
compression. Post procedural scanning was negative for definitive
area of hemorrhage or additional complication. A dressing was
placed. The patient tolerated the procedure well without immediate
post procedural complication.
IMPRESSION: Technically successful ultrasound guided core needle biopsy of
indeterminate dominant mass within the caudal aspect of the right
lobe of the liver.

## 2023-01-04 DIAGNOSIS — Z113 Encounter for screening for infections with a predominantly sexual mode of transmission: Secondary | ICD-10-CM | POA: Diagnosis not present

## 2023-01-04 DIAGNOSIS — N898 Other specified noninflammatory disorders of vagina: Secondary | ICD-10-CM | POA: Diagnosis not present

## 2023-08-02 DIAGNOSIS — E559 Vitamin D deficiency, unspecified: Secondary | ICD-10-CM | POA: Diagnosis not present

## 2023-08-02 DIAGNOSIS — Z1331 Encounter for screening for depression: Secondary | ICD-10-CM | POA: Diagnosis not present

## 2023-08-02 DIAGNOSIS — Z13220 Encounter for screening for lipoid disorders: Secondary | ICD-10-CM | POA: Diagnosis not present

## 2023-08-02 DIAGNOSIS — Z0000 Encounter for general adult medical examination without abnormal findings: Secondary | ICD-10-CM | POA: Diagnosis not present

## 2023-08-02 DIAGNOSIS — R768 Other specified abnormal immunological findings in serum: Secondary | ICD-10-CM | POA: Diagnosis not present

## 2023-08-02 DIAGNOSIS — Z6836 Body mass index (BMI) 36.0-36.9, adult: Secondary | ICD-10-CM | POA: Diagnosis not present

## 2023-08-02 DIAGNOSIS — L918 Other hypertrophic disorders of the skin: Secondary | ICD-10-CM | POA: Diagnosis not present

## 2023-08-02 DIAGNOSIS — J45990 Exercise induced bronchospasm: Secondary | ICD-10-CM | POA: Diagnosis not present
